# Patient Record
Sex: Female | Born: 1995 | Race: White | Hispanic: No | Marital: Married | Smoking: Never smoker
Health system: Southern US, Community
[De-identification: ages and names within clinical notes are randomized; demographics above are authoritative.]

## PROBLEM LIST (undated history)

## (undated) ENCOUNTER — Inpatient Hospital Stay: Payer: Self-pay

## (undated) DIAGNOSIS — R011 Cardiac murmur, unspecified: Secondary | ICD-10-CM

## (undated) DIAGNOSIS — F4310 Post-traumatic stress disorder, unspecified: Secondary | ICD-10-CM

## (undated) DIAGNOSIS — G459 Transient cerebral ischemic attack, unspecified: Secondary | ICD-10-CM

## (undated) DIAGNOSIS — F419 Anxiety disorder, unspecified: Secondary | ICD-10-CM

## (undated) DIAGNOSIS — F909 Attention-deficit hyperactivity disorder, unspecified type: Secondary | ICD-10-CM

## (undated) DIAGNOSIS — J45909 Unspecified asthma, uncomplicated: Secondary | ICD-10-CM

## (undated) DIAGNOSIS — F32A Depression, unspecified: Secondary | ICD-10-CM

## (undated) DIAGNOSIS — Z8679 Personal history of other diseases of the circulatory system: Secondary | ICD-10-CM

## (undated) DIAGNOSIS — R87629 Unspecified abnormal cytological findings in specimens from vagina: Secondary | ICD-10-CM

## (undated) DIAGNOSIS — R569 Unspecified convulsions: Secondary | ICD-10-CM

## (undated) HISTORY — DX: Cardiac murmur, unspecified: R01.1

## (undated) HISTORY — DX: Unspecified asthma, uncomplicated: J45.909

## (undated) HISTORY — DX: Unspecified abnormal cytological findings in specimens from vagina: R87.629

## (undated) HISTORY — PX: OTHER SURGICAL HISTORY: SHX169

## (undated) HISTORY — PX: WISDOM TOOTH EXTRACTION: SHX21

## (undated) HISTORY — DX: Personal history of other diseases of the circulatory system: Z86.79

## (undated) HISTORY — DX: Post-traumatic stress disorder, unspecified: F43.10

## (undated) HISTORY — DX: Depression, unspecified: F32.A

## (undated) HISTORY — DX: Transient cerebral ischemic attack, unspecified: G45.9

---
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
## 2015-07-21 ENCOUNTER — Emergency Department
Admission: EM | Admit: 2015-07-21 | Discharge: 2015-07-21 | Disposition: A | Discharge status: Home / Self Care | Payer: Medicaid - Out of State | Attending: Emergency Medicine | Admitting: Emergency Medicine

## 2015-07-21 ENCOUNTER — Emergency Department: Payer: Medicaid - Out of State

## 2015-07-21 ENCOUNTER — Encounter: Payer: Self-pay | Admitting: Emergency Medicine

## 2015-07-21 DIAGNOSIS — N390 Urinary tract infection, site not specified: Secondary | ICD-10-CM | POA: Insufficient documentation

## 2015-07-21 DIAGNOSIS — R079 Chest pain, unspecified: Secondary | ICD-10-CM | POA: Diagnosis present

## 2015-07-21 DIAGNOSIS — R55 Syncope and collapse: Secondary | ICD-10-CM | POA: Diagnosis not present

## 2015-07-21 LAB — CBC
HCT: 35.5 % (ref 35.0–47.0)
HEMOGLOBIN: 11.5 g/dL — AB (ref 12.0–16.0)
MCH: 25.8 pg — ABNORMAL LOW (ref 26.0–34.0)
MCHC: 32.5 g/dL (ref 32.0–36.0)
MCV: 79.3 fL — ABNORMAL LOW (ref 80.0–100.0)
Platelets: 287 10*3/uL (ref 150–440)
RBC: 4.48 MIL/uL (ref 3.80–5.20)
RDW: 16 % — ABNORMAL HIGH (ref 11.5–14.5)
WBC: 5.3 10*3/uL (ref 3.6–11.0)

## 2015-07-21 LAB — URINALYSIS COMPLETE WITH MICROSCOPIC (ARMC ONLY)
Bilirubin Urine: NEGATIVE
GLUCOSE, UA: NEGATIVE mg/dL
KETONES UR: NEGATIVE mg/dL
NITRITE: POSITIVE — AB
PROTEIN: NEGATIVE mg/dL
SPECIFIC GRAVITY, URINE: 1.024 (ref 1.005–1.030)
pH: 5 (ref 5.0–8.0)

## 2015-07-21 LAB — BASIC METABOLIC PANEL
ANION GAP: 4 — AB (ref 5–15)
BUN: 13 mg/dL (ref 6–20)
CALCIUM: 10.3 mg/dL (ref 8.9–10.3)
CO2: 24 mmol/L (ref 22–32)
Chloride: 112 mmol/L — ABNORMAL HIGH (ref 101–111)
Creatinine, Ser: 0.59 mg/dL (ref 0.44–1.00)
GLUCOSE: 107 mg/dL — AB (ref 65–99)
Potassium: 3.6 mmol/L (ref 3.5–5.1)
SODIUM: 140 mmol/L (ref 135–145)

## 2015-07-21 LAB — PREGNANCY, URINE: Preg Test, Ur: NEGATIVE

## 2015-07-21 MED ORDER — CEPHALEXIN 500 MG PO CAPS: 500.0000 mg | ORAL_CAPSULE | Freq: Four times a day (QID) | ORAL | Status: DC

## 2015-07-21 MED ORDER — CEPHALEXIN 500 MG PO CAPS
500.0000 mg | ORAL_CAPSULE | Freq: Once | ORAL | Status: AC
  Administered 2015-07-21: 500 mg via ORAL
  Filled 2015-07-21 (×2): qty 1

## 2015-07-21 NOTE — Discharge Instructions (Signed)
Please drink plenty of fluid to stay well hydrated, eat small regular meals throughout the day. Please get plenty of rest. Take the entire course of antibiotics, even if you're feeling better.  Return to the emergency department if you develop fainting, chest pain, shortness of breath, lightheadedness, fever, or any other symptoms concerning to you.

## 2015-07-21 NOTE — ED Notes (Addendum)
Pt c/o vag bleeding with clots since Sunday, states this morning she started spitting up blood. Pt states she is here today because she is  Having right sided rib pain. Pt A&O

## 2015-07-21 NOTE — ED Provider Notes (Signed)
[facility] Emergency Department Provider Note  ____________________________________________  Time seen: Approximately 4:41 PM  I have reviewed the triage vital signs and the nursing notes.   HISTORY  Chief Complaint Loss of Consciousness and Emesis    HPI Maureen George is a 20 y.o. female with a history of depression presenting with right-sided chest pain. The patient reports that she was cleaning her room when she had the acute onset of a sharp chest pain from the lower rib cage to the axilla. This was associated with diaphoresis but no shortness of breath, nausea or vomiting, palpitations, lightheadedness. She reports that the pain has been "slowly easing off, "and it has always completely resolved at this time. She has not recently had any cough or cold symptoms, fever or chills. She also states "I might have passed out." She describes that her brother in law found heron the ground and she did not know what had happened. She denies any headache, visual changes, speech changes or confusion. No urinary or fecal incontinence. She denies any leg swelling or calf pain, and does not take oral contraceptives nor does she smoke.  SH: Unemployed, engaged, denies tobacco or cocaine.  FH: No family history of blood clots.   History reviewed. No pertinent past medical history.  There are no active problems to display for this patient.   History reviewed. No pertinent past surgical history.  Current Outpatient Rx  Name  Route  Sig  Dispense  Refill  . cephALEXin (KEFLEX) 500 MG capsule   Oral   Take 1 capsule (500 mg total) by mouth 4 (four) times daily.   20 capsule   0     Allergies Review of patient's allergies indicates no known allergies.  No family history on file.  Social History Social History  Substance Use Topics  . Smoking status: Never Smoker   . Smokeless tobacco: None  . Alcohol Use: No    Review of Systems Constitutional: No  fever/chills.No lightheadedness. Possible syncope. Positive diaphoresis  Eyes: No visual changes. ENT: No sore throat. No congestion or rhinorrhea. Cardiovascular: Positive chest pain. Denies palpitations. Respiratory: Denies shortness of breath.  No cough. Gastrointestinal: No abdominal pain.  No nausea, no vomiting.  No diarrhea.  No constipation. Genitourinary: Negative for dysuria. Musculoskeletal: Negative for back pain. Skin: Negative for rash. Neurological: Negative for headaches. No focal numbness, tingling or weakness.   10-point ROS otherwise negative.  ____________________________________________   PHYSICAL EXAM:  VITAL SIGNS: ED Triage Vitals  Enc Vitals Group     BP 07/21/15 1453 111/75 mmHg     Pulse Rate 07/21/15 1453 92     Resp 07/21/15 1453 20     Temp 07/21/15 1453 98.2 F (36.8 C)     Temp Source 07/21/15 1453 Oral     SpO2 07/21/15 1453 100 %     Weight 07/21/15 1453 110 lb (49.896 kg)     Height 07/21/15 1453  (1.676 m)     Head Cir --      Peak Flow --      Pain Score 07/21/15 1454 6     Pain Loc --      Pain Edu? --      Excl. in GC? --     Constitutional: Patient is alert and oriented and able to answer questions appropriately. She does have a mild stutter and difficulty initiating words, which is baseline for her. She is nontoxic in appearance.  Eyes: Conjunctivae are normal.  EOMI. No scleral icterus. Head: Atraumatic. Nose: No congestion/rhinnorhea. Mouth/Throat: Mucous membranes are dry.  Neck: No stridor.  Supple.  No JVD. No meningismus. Cardiovascular: Normal rate, regular rhythm. No murmurs, rubs or gallops. No tenderness to palpation over the right chest. Respiratory: Normal respiratory effort.  No accessory muscle use or retractions. Lungs CTAB.  No wheezes, rales or ronchi. Gastrointestinal: Soft, nontender and nondistended.  No guarding or rebound.  No peritoneal signs. Musculoskeletal: No LE edema. No ttp in the calves or  palpable cords.  Negative Homan's sign. Neurologic:  A&Ox3.  Speech is clear.  Face and smile are symmetric.  EOMI.  Moves all extremities well. Skin:  Skin is warm, dry and intact. No rash noted. Psychiatric: Mood and affect are normal. Speech and behavior are normal.  Normal judgement.  ____________________________________________   LABS (all labs ordered are listed, but only abnormal results are displayed)  Labs Reviewed  BASIC METABOLIC PANEL - Abnormal; Notable for the following:    Chloride 112 (*)    Glucose, Bld 107 (*)    Anion gap 4 (*)    All other components within normal limits  CBC - Abnormal; Notable for the following:    Hemoglobin 11.5 (*)    MCV 79.3 (*)    MCH 25.8 (*)    RDW 16.0 (*)    All other components within normal limits  URINALYSIS COMPLETEWITH MICROSCOPIC ([facility] ONLY) - Abnormal; Notable for the following:    Color, Urine YELLOW (*)    APPearance CLOUDY (*)    Hgb urine dipstick 1+ (*)    Nitrite POSITIVE (*)    Leukocytes, UA 2+ (*)    Bacteria, UA MANY (*)    Squamous Epithelial / LPF 0-5 (*)    All other components within normal limits  PREGNANCY, URINE   ____________________________________________  EKG  ED ECG REPORT I, Rockne MenghiniNorman, Anne-Caroline, the attending physician, personally viewed and interpreted this ECG.   Date: 07/21/2015  EKG Time: 1608  Rate: 68  Rhythm: normal sinus rhythm  Axis: Normal  Intervals:none  ST&T Change: Nonspecific T-wave inversions in V1, V3, V4. No ST elevation. No evidence of prolonged QTC. No evidence of hypertrophy. No evidence of Brugada.  ____________________________________________  RADIOLOGY  Dg Chest 2 View  07/21/2015  CLINICAL DATA:  Chest pain for 1 day EXAM: CHEST  2 VIEW COMPARISON:  None. FINDINGS: The lungs are clear. The heart size and pulmonary vascularity are normal. No adenopathy. No bone lesions. There is mild bowel dilatation. 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 IMPRESSION: No edema or consolidation. Question a degree of bowel ileus. Apparent hypoplasia of right breast. Electronically Signed   By: Bretta BangWilliam  Woodruff III M.D.   On: 07/21/2015 17:06    ____________________________________________   PROCEDURES  Procedure(s) performed: None  Critical Care performed: No ____________________________________________   INITIAL IMPRESSION / ASSESSMENT AND PLAN / ED COURSE  Pertinent labs & imaging results that were available during my care of the patient were reviewed by me and considered in my medical decision making (see chart for details).  20 y.o. female, presenting with right-sided chest pain and a likely syncopal episode. On my exam, the patient has stable vital signs. The only notable finding is significant mouth dryness which may be from hypovolemia or dehydration. The patient does not have any arrhythmia, ischemic changes or findings concerning for hypertrophy, Brugada syndrome or prolonged QTC on her EKG. Her labs do show urinary tract infection and  will initiate antibiotics at this time. She has been completely asymptomatic from this standpoint. If the patient's chest x-ray is reassuring, I plan is to discharge the patient home with close PMD follow-up.  ----------------------------------------- 5:13 PM on 07/21/2015 -----------------------------------------  The patient's chest x-ray does not show any acute cardiopulmonary abnormalities. There is a question of possible ileus, but I have again reviewed with the patient that she has not been having any GI symptoms including nausea vomiting diarrhea or constipation, nor any abdominal pain. She does not have any distention on her belly and there is no other recent why she would have an ileus. She does understand return precautions as well as follow-up instructions.  ____________________________________________  FINAL CLINICAL IMPRESSION(S) / ED DIAGNOSES  Final diagnoses:  Chest pain,  unspecified chest pain type  Syncope, unspecified syncope type  UTI (lower urinary tract infection)      NEW MEDICATIONS STARTED DURING THIS VISIT:  New Prescriptions   CEPHALEXIN (KEFLEX) 500 MG CAPSULE    Take 1 capsule (500 mg total) by mouth 4 (four) times daily.     Rockne Menghini, MD 07/21/15 1714

## 2015-07-21 NOTE — ED Notes (Signed)
Pt comes into the ED via EMS from home with c/o not feeling well for the past few days with N/V.Maureen George. States today the brother found the pt on the floor and was able to arouse her without a problem, states pt is c/o left rib pain from fall..Maureen George

## 2015-08-13 ENCOUNTER — Encounter: Payer: Self-pay | Admitting: *Deleted

## 2015-08-13 ENCOUNTER — Emergency Department
Admission: EM | Admit: 2015-08-13 | Discharge: 2015-08-14 | Disposition: A | Discharge status: Home / Self Care | Payer: Medicaid - Out of State | Attending: Emergency Medicine | Admitting: Emergency Medicine

## 2015-08-13 DIAGNOSIS — F909 Attention-deficit hyperactivity disorder, unspecified type: Secondary | ICD-10-CM | POA: Insufficient documentation

## 2015-08-13 DIAGNOSIS — F419 Anxiety disorder, unspecified: Secondary | ICD-10-CM | POA: Insufficient documentation

## 2015-08-13 HISTORY — DX: Anxiety disorder, unspecified: F41.9

## 2015-08-13 HISTORY — DX: Attention-deficit hyperactivity disorder, unspecified type: F90.9

## 2015-08-13 LAB — CBC
HEMATOCRIT: 36.4 % (ref 35.0–47.0)
HEMOGLOBIN: 11.7 g/dL — AB (ref 12.0–16.0)
MCH: 25.7 pg — ABNORMAL LOW (ref 26.0–34.0)
MCHC: 32.1 g/dL (ref 32.0–36.0)
MCV: 80.2 fL (ref 80.0–100.0)
Platelets: 279 10*3/uL (ref 150–440)
RBC: 4.54 MIL/uL (ref 3.80–5.20)
RDW: 16.9 % — AB (ref 11.5–14.5)
WBC: 7.1 10*3/uL (ref 3.6–11.0)

## 2015-08-13 LAB — POCT PREGNANCY, URINE: Preg Test, Ur: NEGATIVE

## 2015-08-13 LAB — URINE DRUG SCREEN, QUALITATIVE (ARMC ONLY)
Amphetamines, Ur Screen: NOT DETECTED
BARBITURATES, UR SCREEN: NOT DETECTED
BENZODIAZEPINE, UR SCRN: NOT DETECTED
COCAINE METABOLITE, UR ~~LOC~~: NOT DETECTED
Cannabinoid 50 Ng, Ur ~~LOC~~: NOT DETECTED
MDMA (Ecstasy)Ur Screen: NOT DETECTED
Methadone Scn, Ur: NOT DETECTED
OPIATE, UR SCREEN: NOT DETECTED
PHENCYCLIDINE (PCP) UR S: NOT DETECTED
Tricyclic, Ur Screen: NOT DETECTED

## 2015-08-13 LAB — ACETAMINOPHEN LEVEL: Acetaminophen (Tylenol), Serum: <10 ug/mL — ABNORMAL LOW (ref 10–30)

## 2015-08-13 LAB — COMPREHENSIVE METABOLIC PANEL
ALBUMIN: 4.8 g/dL (ref 3.5–5.0)
ALK PHOS: 88 U/L (ref 38–126)
ALT: 15 U/L (ref 14–54)
ANION GAP: 7 (ref 5–15)
AST: 19 U/L (ref 15–41)
BILIRUBIN TOTAL: 0.4 mg/dL (ref 0.3–1.2)
BUN: 11 mg/dL (ref 6–20)
CALCIUM: 10 mg/dL (ref 8.9–10.3)
CO2: 23 mmol/L (ref 22–32)
CREATININE: 0.57 mg/dL (ref 0.44–1.00)
Chloride: 109 mmol/L (ref 101–111)
GFR calc non Af Amer: >60 mL/min (ref >60)
GLUCOSE: 91 mg/dL (ref 65–99)
Potassium: 4 mmol/L (ref 3.5–5.1)
Sodium: 139 mmol/L (ref 135–145)
TOTAL PROTEIN: 7.9 g/dL (ref 6.5–8.1)

## 2015-08-13 LAB — ETHANOL: Alcohol, Ethyl (B): <5 mg/dL (ref <5)

## 2015-08-13 LAB — SALICYLATE LEVEL: Salicylate Lvl: <4 mg/dL (ref 2.8–30.0)

## 2015-08-13 MED ORDER — TRAZODONE HCL 50 MG PO TABS
50.0000 mg | ORAL_TABLET | Freq: Every day | ORAL | Status: DC
Start: 2015-08-13 — End: 2018-03-21

## 2015-08-13 MED ORDER — ACETAMINOPHEN 325 MG PO TABS
650.0000 mg | ORAL_TABLET | Freq: Once | ORAL | Status: AC
  Administered 2015-08-13: 650 mg via ORAL
  Filled 2015-08-13: qty 2

## 2015-08-13 MED ORDER — SERTRALINE HCL 50 MG PO TABS: 50.0000 mg | ORAL_TABLET | Freq: Every day | ORAL | Status: DC

## 2015-08-13 NOTE — BH Assessment (Signed)
Assessment Note  Maureen George is an 20 y.o. female presenting to the ED with concerns of  suddenly having thoughts of harming herself. Pt states she has not taken her zoloft x 3 weeks. Pt reports she hasn't been sleeping well for the past year. She reports she has a prescription for Zoloft but has not had a chance to pick it up.  She also reports since moving to [place], she has not had the chance to connect with an outpatient mental health provider. Pt denies specific plan to harm self. Pt admits to SI in 2014.  She denies any drug alcohol use.  She denies any auditory/visual hallucinations.  Diagnosis: Anxiety  Past Medical History:  Past Medical History  Diagnosis Date  . Anxiety   . ADHD (attention deficit hyperactivity disorder)     Past Surgical History  Procedure Laterality Date  . Wisdom tooth extraction      Family History: History reviewed. No pertinent family history.  Social History:  reports that she has never smoked. She has never used smokeless tobacco. She reports that she drinks alcohol. She reports that she does not use illicit drugs.  Additional Social History:  Alcohol / Drug Use History of alcohol / drug use?: No history of alcohol / drug abuse (Pt denies)  CIWA: CIWA-Ar BP: (!) 109/54 mmHg Pulse Rate: 75 COWS:    Allergies: No Known Allergies  Home Medications:  (Not in a hospital admission)  OB/GYN Status:  Patient's last menstrual period was 08/02/2015 (exact date).  General Assessment Data Location of Assessment: [facility] ED TTS Assessment: In system Is this a Tele or Face-to-Face Assessment?: Face-to-Face Is this an Initial Assessment or a Re-assessment for this encounter?: Initial Assessment Marital status: Married [place] name: N/A Is patient pregnant?: No Pregnancy Status: No Living Arrangements: Spouse/significant other Can pt return to current living arrangement?: Yes Admission Status: Voluntary Is patient capable of signing voluntary admission?:  Yes Referral Source: Self/Family/Friend Insurance type: Medicaid out of state  Medical Screening Exam [facility] Walk-in ONLY) Medical Exam completed: Yes  Crisis Care Plan Living Arrangements: Spouse/significant other Legal Guardian: Other: (self) Name of Psychiatrist: None reported Name of Therapist: None reported  Education Status Is patient currently in school?: No Current Grade: N/A Highest grade of school patient has completed: N/A Name of school: N/A Contact person: N/A  Risk to self with the past 6 months Suicidal Ideation: Yes-Currently Present Has patient been a risk to self within the past 6 months prior to admission? : No Suicidal Intent: No Has patient had any suicidal intent within the past 6 months prior to admission? : No Is patient at risk for suicide?: No Suicidal Plan?: No Has patient had any suicidal plan within the past 6 months prior to admission? : No Access to Means: No What has been your use of drugs/alcohol within the last 12 months?: None reported Previous Attempts/Gestures: Yes How many times?: 1 Other Self Harm Risks: None reported Triggers for Past Attempts: None known Intentional Self Injurious Behavior: None Family Suicide History: No Recent stressful life event(s): Other (Comment) Persecutory voices/beliefs?: No Depression: No Substance abuse history and/or treatment for substance abuse?: No Suicide prevention information given to non-admitted patients: Not applicable  Risk to Others within the past 6 months Homicidal Ideation: No Does patient have any lifetime risk of violence toward others beyond the six months prior to admission? : No Thoughts of Harm to Others: No Current Homicidal Intent: No Current Homicidal Plan: No Access to Homicidal Means: No  Identified Victim: None identified History of harm to others?: No Assessment of Violence: None Noted Violent Behavior Description: None identified Does patient have access to weapons?:  No Criminal Charges Pending?: No Does patient have a court date: No Is patient on probation?: No  Psychosis Hallucinations: None noted Delusions: None noted  Mental Status Report Appearance/Hygiene: In scrubs Eye Contact: Good Motor Activity: Freedom of movement Speech: Logical/coherent Level of Consciousness: Alert Mood: Anxious Affect: Anxious, Appropriate to circumstance Anxiety Level: Minimal Thought Processes: Coherent, Relevant Judgement: Unimpaired Orientation: Person, Place, Time, Situation Obsessive Compulsive Thoughts/Behaviors: None  Cognitive Functioning Concentration: Good Memory: Recent Intact, Remote Intact IQ: Average Insight: Good Impulse Control: Good Appetite: Good Weight Loss: 0 Weight Gain: 0 Sleep: Decreased Total Hours of Sleep: 2 Vegetative Symptoms: None  ADLScreening [facility] Assessment Services) Patient's cognitive ability adequate to safely complete daily activities?: Yes Patient able to express need for assistance with ADLs?: Yes Independently performs ADLs?: Yes (appropriate for developmental age)  Prior Inpatient Therapy Prior Inpatient Therapy: Yes Prior Therapy Dates: 2014 [facility] Reason for Treatment: suicidal  Prior Outpatient Therapy Prior Outpatient Therapy: No Prior Therapy Dates: N/a Prior Therapy Facilty/Provider(s): N/a Reason for Treatment: N/A Does patient have an ACCT team?: No Does patient have Intensive In-House Services?  : No Does patient have [facility] services? : No Does patient have P4CC services?: No  ADL Screening (condition at time of admission) Patient's cognitive ability adequate to safely complete daily activities?: Yes Patient able to express need for assistance with ADLs?: Yes Independently performs ADLs?: Yes (appropriate for developmental age)       Abuse/Neglect Assessment (Assessment to be complete while patient is alone) Physical Abuse: Denies Verbal Abuse:  Denies Sexual Abuse: Denies Exploitation of patient/patient's resources: Denies Self-Neglect: Denies Values / Beliefs Cultural Requests During Hospitalization: None Spiritual Requests During Hospitalization: None Consults Spiritual Care Consult Needed: No Social Work Consult Needed: No Merchant navy officerAdvance Directives (For Healthcare) Does patient have an advance directive?: No Would patient like information on creating an advanced directive?: Yes English as a second language teacher- Educational materials given    Additional Information 1:1 In Past 12 Months?: No CIRT Risk: No Elopement Risk: No Does patient have medical clearance?: Yes     Disposition:  Disposition Initial Assessment Completed for this Encounter: Yes Disposition of Patient: Other dispositions Other disposition(s): Other (Comment) (Pending)  On Site Evaluation by:   Reviewed with Physician:    Artist Beachoxana C Murle Hellstrom 08/13/2015 8:20 PM

## 2015-08-13 NOTE — ED Notes (Signed)
SOC complete.  

## 2015-08-13 NOTE — ED Notes (Signed)
Patient states some pain to right wrist. States she fell today at home. No deformity, swelling or bruising noted. Full ROM. Will make MD aware.

## 2015-08-13 NOTE — ED Notes (Signed)
Pt states she suddenly had thoughts of harming herself. Pt states she has not taken her zoloft x 3 weeks. Pt endorses poor sleep habits for at least a year. Pt states we might "see her husband" tonight. Pt states her husband at work. Pt states her son is with his father, who is not her husband. Pt states her husband's mother will be letting him know she is at the hospital. Pt states her husband "goes crazy" if he thinks "anything" has happened to her. Pt denies specific plan to harm self. Pt admits to SI in past. Pt's last episode of SI that she was treated for in 2014.

## 2015-08-13 NOTE — ED Notes (Signed)
telepsych in progress now 

## 2015-08-13 NOTE — ED Provider Notes (Signed)
[facility] Emergency Department Provider Note  ____________________________________________    I have reviewed the triage vital signs and the nursing notes.   HISTORY  Chief Complaint Suicidal    HPI Maureen George is a 20 y.o. female who presents with complaints of anxiety and thoughts of harming herself. Patient reports she was doing laundry and suddenly had thoughts of harming herself. She reports these thoughts have resolved and she feels well now. She denies physical complaints. No ingestions or self injury. She has no plan for hurting herself     Past Medical History  Diagnosis Date  . Anxiety   . ADHD (attention deficit hyperactivity disorder)     There are no active problems to display for this patient.   Past Surgical History  Procedure Laterality Date  . Wisdom tooth extraction      Current Outpatient Rx  Name  Route  Sig  Dispense  Refill  . sertraline (ZOLOFT) 100 MG tablet   Oral   Take 1 tablet by mouth daily. Reported on 08/13/2015           Allergies Review of patient's allergies indicates no known allergies.  History reviewed. No pertinent family history.  Social History Social History  Substance Use Topics  . Smoking status: Never Smoker   . Smokeless tobacco: Never Used  . Alcohol Use: Yes    Review of Systems  Constitutional: Negative for fever. Eyes: Negative for redness ENT: Negative for sore throat Cardiovascular: Negative for chest pain Respiratory: Negative for shortness of breath. Gastrointestinal: Negative for abdominal pain Genitourinary: Negative for dysuria. Musculoskeletal: Negative for back pain.Mild right wrist pain Skin: Negative for rash. Neurological: Negative for focal weakness Psychiatric:As above    ____________________________________________   PHYSICAL EXAM:  VITAL SIGNS: ED Triage Vitals  Enc Vitals Group     BP 08/13/15 1907 109/54 mmHg     Pulse Rate 08/13/15 1907 75    Resp 08/13/15 1907 20     Temp 08/13/15 1907 97.6 F (36.4 C)     Temp Source 08/13/15 1907 Oral     SpO2 08/13/15 1907 100 %     Weight 08/13/15 1907 105 lb (47.628 kg)     Height 08/13/15 1907  (1.651 m)     Head Cir --      Peak Flow --      Pain Score --      Pain Loc --      Pain Edu? --      Excl. in GC? --      Constitutional: Alert and oriented. Well appearing and in no distress.  Eyes: Conjunctivae are normal. No erythema or injection ENT   Head: Normocephalic and atraumatic.   Mouth/Throat: Mucous membranes are moist. Cardiovascular: Normal rate, regular rhythm. Normal and symmetric distal pulses are present in the upper extremities.  Respiratory: Normal respiratory effort without tachypnea nor retractions. Breath sounds are clear and equal bilaterally.  Gastrointestinal: Soft and non-tender in all quadrants. No distention. There is no CVA tenderness. Genitourinary: deferred Musculoskeletal: Nontender with normal range of motion in all extremities. No tenderness to palpation of the right wrist, normal range of motion. 2+ pulses. No bony tenderness. No lower extremity tenderness nor edema. Neurologic:  Normal speech and language. No gross focal neurologic deficits are appreciated. Skin:  Skin is warm, dry and intact. No rash noted. Psychiatric: Mood and affect are normal. Patient exhibits appropriate insight and judgment.  ____________________________________________    LABS (pertinent positives/negatives)  Labs Reviewed  ACETAMINOPHEN LEVEL - Abnormal; Notable for the following:    Acetaminophen (Tylenol), Serum <10 (*)    All other components within normal limits  CBC - Abnormal; Notable for the following:    Hemoglobin 11.7 (*)    MCH 25.7 (*)    RDW 16.9 (*)    All other components within normal limits  COMPREHENSIVE METABOLIC PANEL  ETHANOL  SALICYLATE LEVEL  URINE DRUG SCREEN, QUALITATIVE ([facility] ONLY)  POCT PREGNANCY, URINE  POC URINE PREG,  ED    ____________________________________________   EKG  None  ____________________________________________    RADIOLOGY  None  ____________________________________________   PROCEDURES  Procedure(s) performed: none  Critical Care performed: none  ____________________________________________   INITIAL IMPRESSION / ASSESSMENT AND PLAN / ED COURSE  Pertinent labs & imaging results that were available during my care of the patient were reviewed by me and considered in my medical decision making (see chart for details).  Patient well-appearing and in no distress. Her lab work is unremarkable. Suspect she is probably safe for discharge but we will consult telepsychiatry.  ----------------------------------------- 11:05 PM on 08/13/2015 -----------------------------------------  [facility], recommend Zoloft prescription and trazodone prescription which I will write  ____________________________________________   FINAL CLINICAL IMPRESSION(S) / ED DIAGNOSES  Final diagnoses:  Anxiety  Suicidal ideation        Jene Everyobert Vinson Tietze, MD 08/13/15 2334

## 2015-08-13 NOTE — ED Notes (Signed)
Telepsych set up in patient's room. 

## 2015-08-14 NOTE — ED Notes (Signed)
Patient oriented, called several people for ride home, she states that her husband was out fishing,and she did get her uncle that lives in [place] to come pick her up. Patient states that she is happy that he is coming because she has not seen him in a long time. Patient is pleasant and smiling.

## 2015-08-14 NOTE — ED Notes (Signed)
Patient's breakfast served, Patient oriented, no evidence of distress at this time, denies Si/hi , q 15 min. Checks, and camera surveillance.

## 2015-08-14 NOTE — ED Notes (Signed)
D: Pt received from ED-quad, waiting to be discharged home. Patient alert and oriented x4. Patient denies SI/HI/AVH. Pt affect is pleasant. Pt is calm and appropriate to situation.Pt denies feeling anxious or depressed. A: Offered active listening and support. Provided therapeutic communication. R: Pt pleasant and cooperative. Pt lying in bed, awaiting ride home. Will continue Q15 min. checks. Safety maintained.

## 2015-08-14 NOTE — ED Notes (Signed)
Patient with discharge instructions, Patient oriented, voices understanding, prescriptions given, and all belongings given back to Patient. Patient is pleasant and her uncle is going to transport home.

## 2015-09-03 ENCOUNTER — Emergency Department: Payer: Medicaid Other

## 2015-09-03 ENCOUNTER — Encounter: Payer: Self-pay | Admitting: *Deleted

## 2015-09-03 DIAGNOSIS — Z79899 Other long term (current) drug therapy: Secondary | ICD-10-CM | POA: Insufficient documentation

## 2015-09-03 DIAGNOSIS — F909 Attention-deficit hyperactivity disorder, unspecified type: Secondary | ICD-10-CM | POA: Insufficient documentation

## 2015-09-03 DIAGNOSIS — W228XXA Striking against or struck by other objects, initial encounter: Secondary | ICD-10-CM | POA: Insufficient documentation

## 2015-09-03 DIAGNOSIS — S8011XA Contusion of right lower leg, initial encounter: Secondary | ICD-10-CM | POA: Insufficient documentation

## 2015-09-03 DIAGNOSIS — Y929 Unspecified place or not applicable: Secondary | ICD-10-CM | POA: Diagnosis not present

## 2015-09-03 DIAGNOSIS — Y939 Activity, unspecified: Secondary | ICD-10-CM | POA: Insufficient documentation

## 2015-09-03 DIAGNOSIS — S8991XA Unspecified injury of right lower leg, initial encounter: Secondary | ICD-10-CM | POA: Diagnosis present

## 2015-09-03 DIAGNOSIS — Y999 Unspecified external cause status: Secondary | ICD-10-CM | POA: Diagnosis not present

## 2015-09-03 MED ORDER — IBUPROFEN 400 MG PO TABS
400.0000 mg | ORAL_TABLET | Freq: Once | ORAL | Status: AC | PRN
  Administered 2015-09-03: 400 mg via ORAL

## 2015-09-03 MED ORDER — IBUPROFEN 400 MG PO TABS
ORAL_TABLET | ORAL | Status: DC
Start: 2015-09-03 — End: 2015-09-04
  Filled 2015-09-03: qty 1

## 2015-09-03 NOTE — ED Notes (Signed)
Pt presents w/ c/o R leg pain and difficulty bearing weight. Pt states she shut her R leg in a camper door at 1100 today and it has gotten progressively painful over the day. Pt states unable to bear weight on R leg and bending knee is painful.

## 2015-09-04 ENCOUNTER — Emergency Department
Admission: EM | Admit: 2015-09-04 | Discharge: 2015-09-04 | Disposition: A | Discharge status: Home / Self Care | Payer: Medicaid Other | Attending: Emergency Medicine | Admitting: Emergency Medicine

## 2015-09-04 DIAGNOSIS — S8011XA Contusion of right lower leg, initial encounter: Secondary | ICD-10-CM

## 2015-09-04 MED ORDER — KETOROLAC TROMETHAMINE 60 MG/2ML IM SOLN
60.0000 mg | Freq: Once | INTRAMUSCULAR | Status: AC
  Administered 2015-09-04: 60 mg via INTRAMUSCULAR
  Filled 2015-09-04: qty 2

## 2015-09-04 MED ORDER — KETOROLAC TROMETHAMINE 10 MG PO TABS: 10.0000 mg | ORAL_TABLET | Freq: Three times a day (TID) | ORAL | Status: AC | PRN

## 2015-09-04 MED ORDER — KETOROLAC TROMETHAMINE 10 MG PO TABS
10.0000 mg | ORAL_TABLET | Freq: Once | ORAL | Status: DC
  Filled 2015-09-04: qty 1

## 2015-09-04 NOTE — ED Provider Notes (Signed)
[facility] Emergency Department Provider Note  ____________________________________________  Time seen: 12:26 AM  I have reviewed the triage vital signs and the nursing notes.   HISTORY  Chief Complaint Leg Injury      HPI Graylon GunningJerri Reposa is a 20 y.o. female presents with history of accidentally slamming her right leg in a camper door at 11 AM yesterday morning with progressive discomfort since that time. Patient states pain is predominantly right lower leg denies any knee pain     Past Medical History  Diagnosis Date  . Anxiety   . ADHD (attention deficit hyperactivity disorder)     There are no active problems to display for this patient.   Past Surgical History  Procedure Laterality Date  . Wisdom tooth extraction      Current Outpatient Rx  Name  Route  Sig  Dispense  Refill  . sertraline (ZOLOFT) 50 MG tablet   Oral   Take 1 tablet (50 mg total) by mouth daily. For 3 days, then begin taking 2 tablets (100 mg) by mouth daily   20 tablet   0   . traZODone (DESYREL) 50 MG tablet   Oral   Take 1 tablet (50 mg total) by mouth at bedtime.   20 tablet   0     Allergies No known drug allergies History reviewed. No pertinent family history.  Social History Social History  Substance Use Topics  . Smoking status: Never Smoker   . Smokeless tobacco: Never Used  . Alcohol Use: Yes    Review of Systems  Constitutional: Negative for fever. Eyes: Negative for visual changes. ENT: Negative for sore throat. Cardiovascular: Negative for chest pain. Respiratory: Negative for shortness of breath. Gastrointestinal: Negative for abdominal pain, vomiting and diarrhea. Genitourinary: Negative for dysuria. Musculoskeletal: Negative for back pain.Positive for right leg pain Skin: Negative for rash. Neurological: Negative for headaches, focal weakness or numbness.   10-point ROS otherwise  negative.  ____________________________________________   PHYSICAL EXAM:  VITAL SIGNS: ED Triage Vitals  Enc Vitals Group     BP 09/03/15 2302 99/51 mmHg     Pulse Rate 09/03/15 2302 70     Resp 09/03/15 2302 20     Temp 09/03/15 2302 98.3 F (36.8 C)     Temp Source 09/03/15 2302 Oral     SpO2 09/03/15 2302 100 %     Weight 09/03/15 2302 110 lb (49.896 kg)     Height 09/03/15 2302 5\' 6"  (1.676 m)     Head Cir --      Peak Flow --      Pain Score 09/03/15 2303 8     Pain Loc --      Pain Edu? --      Excl. in GC? --    Constitutional: Alert and oriented. Well appearing and in no distress. Eyes: Conjunctivae are normal. PERRL. Normal extraocular movements. ENT   Head: Normocephalic and atraumatic.   Nose: No congestion/rhinnorhea.   Mouth/Throat: Mucous membranes are moist.   Neck: No stridor. Hematological/Lymphatic/Immunilogical: No cervical lymphadenopathy. Cardiovascular: Normal rate, regular rhythm. Normal and symmetric distal pulses are present in all extremities. No murmurs, rubs, or gallops. Respiratory: Normal respiratory effort without tachypnea nor retractions. Breath sounds are clear and equal bilaterally. No wheezes/rales/rhonchi. Gastrointestinal: Soft and nontender. No distention. There is no CVA tenderness. Genitourinary: deferred Musculoskeletal: Pain with palpation lateral malleoli. No pain with palpation or movement of the knee active or passive range of motion.  Neurologic:  Normal speech and language. No gross focal neurologic deficits are appreciated. Speech is normal.  Skin:  Skin is warm, dry and intact. No rash noted. Psychiatric: Mood and affect are normal. Speech and behavior are normal. Patient exhibits appropriate insight and judgment.   RADIOLOGY     DG Tibia/Fibula Right (Final result) Result time: 09/03/15 23:51:56   Final result by Rad Results In Interface (09/03/15 23:51:56)   Narrative:   CLINICAL DATA: 20 year old  female with right lower extremity pain  EXAM: RIGHT TIBIA AND FIBULA - 2 VIEW  COMPARISON: None.  FINDINGS: There is no evidence of fracture or other focal bone lesions. Soft tissues are unremarkable.  IMPRESSION: Negative.   Electronically Signed By: Elgie CollardArash Radparvar M.D. On: 09/03/2015 23:51       Procedures     INITIAL IMPRESSION / ASSESSMENT AND PLAN / ED COURSE  Pertinent labs & imaging results that were available during my care of the patient were reviewed by me and considered in my medical decision making (see chart for details).  Ankle stirrup applied crutches given. Patient received Toradol 30 mg IM shot  ____________________________________________   FINAL CLINICAL IMPRESSION(S) / ED DIAGNOSES  Final diagnoses:  Multiple leg contusions, right, initial encounter      Darci Currentandolph N Matti Minney, MD 09/04/15 [phone]

## 2015-09-04 NOTE — ED Notes (Signed)
Pt discharged to home.  Friend driving.  Discharge instructions reviewed.  Verbalized understanding.  No questions or concerns at this time.  Teach back verified.  Pt in NAD.  No items left in ED.   

## 2015-09-04 NOTE — Discharge Instructions (Signed)

## 2015-09-04 NOTE — ED Notes (Signed)
MD Brown at bedside.

## 2017-08-10 ENCOUNTER — Emergency Department: Payer: Self-pay

## 2017-08-10 ENCOUNTER — Emergency Department
Admission: EM | Admit: 2017-08-10 | Discharge: 2017-08-10 | Disposition: A | Discharge status: Home / Self Care | Payer: Self-pay | Attending: Emergency Medicine | Admitting: Emergency Medicine

## 2017-08-10 ENCOUNTER — Encounter: Payer: Self-pay | Admitting: Emergency Medicine

## 2017-08-10 ENCOUNTER — Other Ambulatory Visit: Payer: Self-pay

## 2017-08-10 DIAGNOSIS — Y9234 Swimming pool (public) as the place of occurrence of the external cause: Secondary | ICD-10-CM | POA: Insufficient documentation

## 2017-08-10 DIAGNOSIS — W16012A Fall into swimming pool striking water surface causing other injury, initial encounter: Secondary | ICD-10-CM | POA: Insufficient documentation

## 2017-08-10 DIAGNOSIS — S93499A Sprain of other ligament of unspecified ankle, initial encounter: Secondary | ICD-10-CM

## 2017-08-10 DIAGNOSIS — Z79899 Other long term (current) drug therapy: Secondary | ICD-10-CM | POA: Insufficient documentation

## 2017-08-10 DIAGNOSIS — F419 Anxiety disorder, unspecified: Secondary | ICD-10-CM | POA: Insufficient documentation

## 2017-08-10 DIAGNOSIS — Y998 Other external cause status: Secondary | ICD-10-CM | POA: Insufficient documentation

## 2017-08-10 DIAGNOSIS — S93491A Sprain of other ligament of right ankle, initial encounter: Secondary | ICD-10-CM | POA: Insufficient documentation

## 2017-08-10 DIAGNOSIS — F909 Attention-deficit hyperactivity disorder, unspecified type: Secondary | ICD-10-CM | POA: Insufficient documentation

## 2017-08-10 DIAGNOSIS — Y9389 Activity, other specified: Secondary | ICD-10-CM | POA: Insufficient documentation

## 2017-08-10 MED ORDER — MELOXICAM 15 MG PO TABS: 15.0000 mg | ORAL_TABLET | Freq: Every day | ORAL | 1 refills | Status: AC

## 2017-08-10 NOTE — ED Provider Notes (Signed)
[facility] Emergency Department Provider Note  ____________________________________________  Time seen: Approximately 11:12 PM  I have reviewed the triage vital signs and the nursing notes.   HISTORY  Chief Complaint Ankle Pain    HPI Maureen George is a 22 y.o. female with a history of anxiety, presents to the emergency department with 7 out of 10 aching and nonradiating right ankle pain after patient reports that approximately 1 week ago she jumped into a pool and landed hard on her right ankle.  Patient reports that she was evaluated at emergency department in [place] and was diagnosed with a right ankle sprain.  Patient reports that her symptoms have not improved and she became concerned.  No alleviating measures have been attempted.  [place]   Past Medical History:  Diagnosis Date  . ADHD (attention deficit hyperactivity disorder)   . Anxiety     There are no active problems to display for this patient.   Past Surgical History:  Procedure Laterality Date  . WISDOM TOOTH EXTRACTION      Prior to Admission medications   Medication Sig Start Date End Date Taking? Authorizing Provider  meloxicam (MOBIC) 15 MG tablet Take 1 tablet (15 mg total) by mouth daily for 7 days. 08/10/17 08/17/17  Orvil FeilWoods, Jaclyn M, PA-C  sertraline (ZOLOFT) 50 MG tablet Take 1 tablet (50 mg total) by mouth daily. For 3 days, then begin taking 2 tablets (100 mg) by mouth daily 08/13/15 08/12/16  Jene EveryKinner, Robert, MD  traZODone (DESYREL) 50 MG tablet Take 1 tablet (50 mg total) by mouth at bedtime. 08/13/15   Jene EveryKinner, Robert, MD    Allergies Naproxen  History reviewed. No pertinent family history.  Social History Social History   Tobacco Use  . Smoking status: Never Smoker  . Smokeless tobacco: Never Used  Substance Use Topics  . Alcohol use: Yes  . Drug use: No     Review of Systems  Constitutional: No fever/chills Eyes: No visual  changes. No discharge ENT: No upper respiratory complaints. Cardiovascular: no chest pain. Respiratory: no cough. No SOB. Gastrointestinal: No abdominal pain.  No nausea, no vomiting.  No diarrhea.  No constipation. Musculoskeletal: Patient has right ankle pain. Skin: Negative for rash, abrasions, lacerations, ecchymosis. Neurological: Negative for headaches, focal weakness or numbness.  ____________________________________________   PHYSICAL EXAM:  VITAL SIGNS: ED Triage Vitals  Enc Vitals Group     BP 08/10/17 1952 (!) 102/49     Pulse Rate 08/10/17 1952 77     Resp 08/10/17 1952 18     Temp 08/10/17 1952 97.6 F (36.4 C)     Temp Source 08/10/17 1952 Oral     SpO2 08/10/17 1952 100 %     Weight 08/10/17 1953 110 lb (49.9 kg)     Height 08/10/17 1953 5\' 6"  (1.676 m)     Head Circumference --      Peak Flow --      Pain Score 08/10/17 1953 10     Pain Loc --      Pain Edu? --      Excl. in GC? --      Constitutional: Alert and oriented. Well appearing and in no acute distress. Eyes: Conjunctivae are normal. PERRL. EOMI. Head: Atraumatic. Cardiovascular: Normal rate, regular rhythm. Normal S1 and S2.  Good peripheral circulation. Respiratory: Normal respiratory effort without tachypnea or retractions. Lungs CTAB. Good air entry to the bases with no decreased or absent breath  sounds. Musculoskeletal: Patient is unable to perform full range of motion at the right ankle, likely secondary to pain.  She is able to move all 5 right toes.  Tenderness is elicited with palpation over the anterior talofibular ligament and posterior talofibular ligament.  Palpable dorsalis pedis pulse, right. Neurologic:  Normal speech and language. No gross focal neurologic deficits are appreciated.  Skin:  Skin is warm, dry and intact. No rash noted. Psychiatric: Mood and affect are normal. Speech and behavior are normal. Patient exhibits appropriate insight and  judgement.   ____________________________________________   LABS (all labs ordered are listed, but only abnormal results are displayed)  Labs Reviewed - No data to display ____________________________________________  EKG   ____________________________________________  RADIOLOGY Geraldo Pitter, personally viewed and evaluated these images (plain radiographs) as part of my medical decision making, as well as reviewing the written report by the radiologist.  Dg Ankle Complete Right  Result Date: 08/10/2017 CLINICAL DATA:  Right ankle pain after jumping into a pool and landing on the bottom 3 days ago. EXAM: RIGHT ANKLE - COMPLETE 3+ VIEW COMPARISON:  Right lower leg dated 09/03/2015. FINDINGS: There is no evidence of fracture, dislocation, or joint effusion. There is no evidence of arthropathy or other focal bone abnormality. Soft tissues are unremarkable. IMPRESSION: Normal examination. Electronically Signed   By: Beckie Salts M.D.   On: 08/10/2017 20:29    ____________________________________________    PROCEDURES  Procedure(s) performed:    Procedures    Medications - No data to display   ____________________________________________   INITIAL IMPRESSION / ASSESSMENT AND PLAN / ED COURSE  Pertinent labs & imaging results that were available during my care of the patient were reviewed by me and considered in my medical decision making (see chart for details).  Review of the [place] CSRS was performed in accordance of the [facility] prior to dispensing any controlled drugs.     Assessment and plan Right ankle sprain Patient presents to the emergency department with right ankle pain after sustaining an inversion type ankle injury approximately 1 week ago after jumping into a pool.  Differential diagnosis included sprain versus fracture.  No acute bony abnormalities were identified on x-ray examination of the right ankle.  Crutches were provided and Ace wrap was applied right  ankle.  Rest, compression, ice and elevation were recommended.  Patient was referred to podiatry.  All patient questions were answered.    ____________________________________________  FINAL CLINICAL IMPRESSION(S) / ED DIAGNOSES  Final diagnoses:  Sprain of anterior talofibular ligament, unspecified laterality, initial encounter      NEW MEDICATIONS STARTED DURING THIS VISIT:  ED Discharge Orders        Ordered    meloxicam (MOBIC) 15 MG tablet  Daily     08/10/17 2153          This chart was dictated using voice recognition software/Dragon. Despite best efforts to proofread, errors can occur which can change the meaning. Any change was purely unintentional.    Orvil Feil, PA-C 08/10/17 2315    Phineas Semen, MD 08/12/17 [phone]

## 2017-08-10 NOTE — ED Notes (Signed)
Patient to waiting room via wheelchair by EMS.  Per EMS patient report injured right ankle jumping into a pool on Tuesday, not feeling any better.  EMS VS:  HR - 73; BP 120/62; pulse oxi 99% on room air.

## 2017-08-10 NOTE — ED Triage Notes (Signed)
Pt presents to ED via [facility] with c/o R ankle pain. States on Tuesday jumped into a pool and landed on the bottom, was dx at a hospital in [place] with a R ankle sprain but pain has not been relieved. Pt texting on phone during triage.

## 2017-08-10 NOTE — ED Notes (Signed)
Pt states that she just moved here from [place] and she went swimming, she jumped in the pool hitting the bottom too hard and landed on her right ankle the wrong way.

## 2018-03-21 ENCOUNTER — Emergency Department: Payer: Medicaid Other

## 2018-03-21 ENCOUNTER — Encounter: Payer: Self-pay | Admitting: Emergency Medicine

## 2018-03-21 ENCOUNTER — Emergency Department
Admission: EM | Admit: 2018-03-21 | Discharge: 2018-03-22 | Disposition: A | Discharge status: Home / Self Care | Payer: Medicaid Other | Attending: Emergency Medicine | Admitting: Emergency Medicine

## 2018-03-21 DIAGNOSIS — O99340 Other mental disorders complicating pregnancy, unspecified trimester: Secondary | ICD-10-CM | POA: Diagnosis not present

## 2018-03-21 DIAGNOSIS — Z91040 Latex allergy status: Secondary | ICD-10-CM | POA: Insufficient documentation

## 2018-03-21 DIAGNOSIS — R55 Syncope and collapse: Secondary | ICD-10-CM | POA: Insufficient documentation

## 2018-03-21 DIAGNOSIS — O2340 Unspecified infection of urinary tract in pregnancy, unspecified trimester: Secondary | ICD-10-CM | POA: Diagnosis not present

## 2018-03-21 DIAGNOSIS — O2341 Unspecified infection of urinary tract in pregnancy, first trimester: Secondary | ICD-10-CM

## 2018-03-21 DIAGNOSIS — F909 Attention-deficit hyperactivity disorder, unspecified type: Secondary | ICD-10-CM | POA: Insufficient documentation

## 2018-03-21 DIAGNOSIS — O26819 Pregnancy related exhaustion and fatigue, unspecified trimester: Secondary | ICD-10-CM | POA: Diagnosis present

## 2018-03-21 DIAGNOSIS — F419 Anxiety disorder, unspecified: Secondary | ICD-10-CM | POA: Diagnosis not present

## 2018-03-21 DIAGNOSIS — Z3A00 Weeks of gestation of pregnancy not specified: Secondary | ICD-10-CM | POA: Insufficient documentation

## 2018-03-21 DIAGNOSIS — Z3490 Encounter for supervision of normal pregnancy, unspecified, unspecified trimester: Secondary | ICD-10-CM

## 2018-03-21 LAB — URINALYSIS, COMPLETE (UACMP) WITH MICROSCOPIC
BILIRUBIN URINE: NEGATIVE
GLUCOSE, UA: NEGATIVE mg/dL
HGB URINE DIPSTICK: NEGATIVE
KETONES UR: 5 mg/dL — AB
LEUKOCYTES UA: NEGATIVE
Nitrite: POSITIVE — AB
PROTEIN: 30 mg/dL — AB
Specific Gravity, Urine: 1.026 (ref 1.005–1.030)
pH: 6 (ref 5.0–8.0)

## 2018-03-21 LAB — BASIC METABOLIC PANEL
Anion gap: 6 (ref 5–15)
BUN: 8 mg/dL (ref 6–20)
CHLORIDE: 107 mmol/L (ref 98–111)
CO2: 22 mmol/L (ref 22–32)
Calcium: 10.2 mg/dL (ref 8.9–10.3)
Creatinine, Ser: 0.63 mg/dL (ref 0.44–1.00)
GFR calc Af Amer: >60 mL/min (ref >60)
GFR calc non Af Amer: >60 mL/min (ref >60)
GLUCOSE: 112 mg/dL — AB (ref 70–99)
POTASSIUM: 3.6 mmol/L (ref 3.5–5.1)
Sodium: 135 mmol/L (ref 135–145)

## 2018-03-21 LAB — CBC
HEMATOCRIT: 42.6 % (ref 36.0–46.0)
HEMOGLOBIN: 14.5 g/dL (ref 12.0–15.0)
MCH: 29.9 pg (ref 26.0–34.0)
MCHC: 34 g/dL (ref 30.0–36.0)
MCV: 87.8 fL (ref 80.0–100.0)
Platelets: 203 10*3/uL (ref 150–400)
RBC: 4.85 MIL/uL (ref 3.87–5.11)
RDW: 12.4 % (ref 11.5–15.5)
WBC: 5.7 10*3/uL (ref 4.0–10.5)
nRBC: 0 % (ref 0.0–0.2)

## 2018-03-21 LAB — POCT PREGNANCY, URINE: Preg Test, Ur: POSITIVE — AB

## 2018-03-21 LAB — TROPONIN I: Troponin I: <0.03 ng/mL (ref <0.03)

## 2018-03-21 LAB — HCG, QUANTITATIVE, PREGNANCY: hCG, Beta Chain, Quant, S: 192791 m[IU]/mL — ABNORMAL HIGH (ref <5)

## 2018-03-21 MED ORDER — SODIUM CHLORIDE 0.9% FLUSH: 3.0000 mL | Freq: Once | INTRAVENOUS | Status: DC

## 2018-03-21 NOTE — ED Triage Notes (Addendum)
Patient c/o chest pain, SOB, and LOC today. Patient passed out at church, was informed afterwards she was out for 10 minutes. Patient denies injury from LOC.   Patient also reports that she has been vomiting a lot recently and her period is 1 month late - worried about possibility of pregnancy.

## 2018-03-21 NOTE — ED Notes (Signed)
Reports was in church today and had an asthma attack causing her to black out. Lungs clear b/l. Sating 100% on room air. Awaiting md eval and plan of care.

## 2018-03-22 MED ORDER — CEPHALEXIN 500 MG PO CAPS
500.0000 mg | ORAL_CAPSULE | Freq: Once | ORAL | Status: AC
  Administered 2018-03-22: 500 mg via ORAL
  Filled 2018-03-22: qty 1

## 2018-03-22 MED ORDER — CEPHALEXIN 500 MG PO CAPS: 500.0000 mg | ORAL_CAPSULE | Freq: Two times a day (BID) | ORAL | 0 refills | Status: DC

## 2018-03-22 MED ORDER — DOXYLAMINE-PYRIDOXINE 10-10 MG PO TBEC: DELAYED_RELEASE_TABLET | ORAL | 1 refills | Status: DC

## 2018-03-22 NOTE — ED Notes (Signed)
Patient requesting something to eat, as per Md ok to feed patient. Patient given lunch box and juice, tolerated well.

## 2018-03-22 NOTE — Discharge Instructions (Signed)
Your evaluation was reassuring today.  We did discover that you are in the early stages of pregnancy and need to follow-up at [facility] at the next available opportunity to establish a prenatal provider.  We have provided you with 2 different phone numbers that you can use to schedule an appointment.  We recommend you eat and drink plenty of food and fluids to stay hydrated and to have good nutrition and start taking prenatal vitamins.  I prescribed you a medication for your urinary tract infection (cephalexin, or Keflex) as well as a medication that may help with the nausea and vomiting you have been having (Diclegis).  Please follow-up with the next available opportunity and return to the emergency department if you develop new or worsening symptoms.

## 2018-03-22 NOTE — ED Provider Notes (Signed)
[facility] Emergency Department Provider Note  ____________________________________________   First MD Initiated Contact with Patient 03/21/18 2351     (approximate)  I have reviewed the triage vital signs and the nursing notes.   HISTORY  Chief Complaint Loss of Consciousness and Shortness of Breath    HPI Maureen George is a 22 y.o. female G2P1 who did not know she was pregnant and has one child at home.  She presents by private vehicle for evaluation after passing out earlier tonight. She says that she has asthma and passes out frequently.  She is not at all concerned about what happened and says that this is more or less normal for her.  She denies recent fever/chills, chest pain, nausea, vomiting, abdominal pain, vaginal bleeding, and dysuria.  She says that friends from church convinced her to come in.  She denies any shortness of breath in spite of the reported history of asthma.  She says that she has been eating and drinking recently but she has been vomiting over the last couple of weeks.  She has not had a menstrual period for over a month and she wonders if it is possible she could be pregnant.  She has not had any vaginal bleeding and denies abdominal pain or pelvic pain.  Of note, she says that her blood pressure is always low, she frequently passes out, and that she is always been thin.  Past Medical History:  Diagnosis Date  . ADHD (attention deficit hyperactivity disorder)   . Anxiety     There are no active problems to display for this patient.   Past Surgical History:  Procedure Laterality Date  . WISDOM TOOTH EXTRACTION      Prior to Admission medications   Medication Sig Start Date End Date Taking? Authorizing Provider  cephALEXin (KEFLEX) 500 MG capsule Take 1 capsule (500 mg total) by mouth 2 (two) times daily. 03/22/18   Loleta Rose, MD  Doxylamine-Pyridoxine (DICLEGIS) 10-10 MG TBEC Two tablets at bedtime on day 1 and 2; if  symptoms persist, take 1 tablet in morning and 2 tablets at bedtime on day 3; if symptoms persist, may increase to 1 tablet in morning, 1 tablet mid-afternoon, and 2 tablets at bedtime on day 4 for a maximum of 4 tablets per day. Use the minimum dose necessary to control your symptoms. 03/22/18   Loleta Rose, MD    Allergies Lamictal [lamotrigine]; Latex; and Naproxen  No family history on file.  Social History Social History   Tobacco Use  . Smoking status: Never Smoker  . Smokeless tobacco: Never Used  Substance Use Topics  . Alcohol use: Yes  . Drug use: No    Review of Systems Constitutional: No fever/chills Eyes: No visual changes. ENT: No sore throat. Cardiovascular: Reportedly passed out earlier tonight.  Denies chest pain. Respiratory: Some degree of chronic shortness of breath that she says is secondary to asthma but no shortness of breath today. Gastrointestinal: No abdominal pain.  Intermittent nausea and vomiting for the last couple of weeks..  No diarrhea.  No constipation. Genitourinary: Negative for dysuria.  No menstrual cycle for more than a month, concerned about possible pregnancy.  No vaginal bleeding. Musculoskeletal: Negative for neck pain.  Negative for back pain. Integumentary: Negative for rash. Neurological: Negative for headaches, focal weakness or numbness.   ____________________________________________   PHYSICAL EXAM:  VITAL SIGNS: ED Triage Vitals  Enc Vitals Group     BP 03/21/18 2014 (!) 96/59  Pulse Rate 03/21/18 2014 70     Resp 03/21/18 2014 15     Temp 03/21/18 2014 98 F (36.7 C)     Temp Source 03/21/18 2014 Oral     SpO2 03/21/18 2014 99 %     Weight 03/21/18 2009 49.9 kg (110 lb)     Height 03/21/18 2009 1.676 m (5\' 6" )     Head Circumference --      Peak Flow --      Pain Score 03/21/18 2009 8     Pain Loc --      Pain Edu? --      Excl. in GC? --     Constitutional: Alert and oriented.  Thin body habitus but no  acute distress and generally well-appearing. Eyes: Conjunctivae are normal.  Head: Atraumatic. Nose: No congestion/rhinnorhea. Mouth/Throat: Mucous membranes are moist.  Poor dentition. Neck: No stridor.  No meningeal signs.   Cardiovascular: Normal rate, regular rhythm. Good peripheral circulation. Grossly normal heart sounds. Respiratory: Normal respiratory effort.  No retractions. Lungs CTAB. Gastrointestinal: Soft and nontender. No distention.  Musculoskeletal: No lower extremity tenderness nor edema. No gross deformities of extremities. Neurologic:  Normal speech and language. No gross focal neurologic deficits are appreciated.  Skin:  Skin is warm, dry and intact. No rash noted. Psychiatric: Mood and affect are normal. Speech and behavior are normal.  ____________________________________________   LABS (all labs ordered are listed, but only abnormal results are displayed)  Labs Reviewed  BASIC METABOLIC PANEL - Abnormal; Notable for the following components:      Result Value   Glucose, Bld 112 (*)    All other components within normal limits  URINALYSIS, COMPLETE (UACMP) WITH MICROSCOPIC - Abnormal; Notable for the following components:   Color, Urine AMBER (*)    APPearance HAZY (*)    Ketones, ur 5 (*)    Protein, ur 30 (*)    Nitrite POSITIVE (*)    Bacteria, UA MANY (*)    All other components within normal limits  HCG, QUANTITATIVE, PREGNANCY - Abnormal; Notable for the following components:   hCG, Beta Chain, Quant, S 192,791 (*)    All other components within normal limits  POCT PREGNANCY, URINE - Abnormal; Notable for the following components:   Preg Test, Ur POSITIVE (*)    All other components within normal limits  URINE CULTURE  CBC  TROPONIN I  POC URINE PREG, ED   ____________________________________________  EKG  ED ECG REPORT I, Loleta Roseory Nakeysha Pasqual, the attending physician, personally viewed and interpreted this ECG.  Date: 03/21/2018 EKG Time: 20:  08 Rate: 62 Rhythm: normal sinus rhythm QRS Axis: normal Intervals: normal ST/T Wave abnormalities: Non-specific ST segment / T-wave changes, but no clear evidence of acute ischemia. Narrative Interpretation: no definitive evidence of acute ischemia; does not meet STEMI criteria.   ____________________________________________  RADIOLOGY  ED MD interpretation: No indication for chest x-ray  Official radiology report(s): No results found.  ____________________________________________   PROCEDURES  Critical Care performed: No   Procedure(s) performed:   Procedures   ____________________________________________   INITIAL IMPRESSION / ASSESSMENT AND PLAN / ED COURSE  As part of my medical decision making, I reviewed the following data within the electronic MEDICAL RECORD NUMBER Nursing notes reviewed and incorporated, Labs reviewed , EKG interpreted , Old chart reviewed and Notes from prior ED visits    Differential diagnosis includes, but is not limited to, volume depletion secondary to decreased oral intake or recent nausea/vomiting leading  to orthostasis and syncope, cardiogenic syncope, pulmonary embolism, acute internal hemorrhage leading to hypotension and anemia, pregnancy, acute infection.  The patient is generally well-appearing, I am concerned about her symptoms, and asymptomatic at this time.  She states that all of the things I mentioned as a possible concern (low blood pressure, passing out, intermittent shortness of breath, etc.) are normal for her and she is not worried about them.  Given her thin habitus and poor dentition, I mention to her about her being thin and she says this is normal.  I suspect it may be possible that she could have an eating disorder but she is not volunteering any information and has capacity to make her own decisions and I will not pursue this line of questioning any further.  Her medical work-up is generally reassuring.  She has a normal  basic metabolic panel including electrolytes.  CBC is within normal limits including a normal hemoglobin and no leukocytosis.  Her urinalysis is nitrite positive and consistent with infection and I have sent off a urine culture.  She has a few ketones in her urine likely reflective of decreased oral intake and/or vomiting.  Urine pregnancy test is positive with a beta hCG of greater than 192,000.  Troponin is negative.  EKG is nonspecific but generally reassuring.  The patient asked for something to eat and ate a meal tray.  She is taking good oral intake and not vomiting at this time.  [place] and I encouraged her to [facility].  She is comfortable not pursuing any additional work-up at this time.  I do not feel she would benefit from imaging and there is nothing to make me believe she has had a pulmonary embolism, and I feel that the risk of a CTA chest to her pregnancy would be much greater than any possible benefit or any risk that she has a PE.  I encourage close outpatient follow-up and prescribed antibiotics for her UTI.  She will establish prenatal care at the next available opportunity.  I gave my usual and customary return precautions.     ____________________________________________  FINAL CLINICAL IMPRESSION(S) / ED DIAGNOSES  Final diagnoses:  Syncope and collapse  Early stage of pregnancy  UTI (urinary tract infection) during pregnancy, first trimester     MEDICATIONS GIVEN DURING THIS VISIT:  Medications  sodium chloride flush (NS) 0.9 % injection 3 mL (has no administration in time range)  cephALEXin (KEFLEX) capsule 500 mg (500 mg Oral Given 03/22/18 0139)     ED Discharge Orders         Ordered    cephALEXin (KEFLEX) 500 MG capsule  2 times daily     03/22/18 0140    Doxylamine-Pyridoxine (DICLEGIS) 10-10 MG TBEC     03/22/18 0140           Note:  This document was prepared using Dragon voice recognition  software and may include unintentional dictation errors.    Loleta RoseForbach, Aijah Lattner, MD 03/22/18 [phone]

## 2018-03-22 NOTE — ED Notes (Signed)
Po meds given for positive UTI.

## 2018-03-24 LAB — URINE CULTURE
Culture: >=100000 — AB
Special Requests: NORMAL

## 2018-05-10 DIAGNOSIS — J45909 Unspecified asthma, uncomplicated: Secondary | ICD-10-CM | POA: Diagnosis present

## 2019-03-08 DIAGNOSIS — F411 Generalized anxiety disorder: Secondary | ICD-10-CM | POA: Diagnosis present

## 2019-12-30 IMAGING — DX DG ANKLE COMPLETE 3+V*R*
3 series · 3 of 3 positions shown · non-contrast
Comparison: Right lower leg dated 09/03/2015.

CLINICAL DATA: Right ankle pain after jumping into a pool and
landing on the bottom 3 days ago.

EXAM:
RIGHT ANKLE - COMPLETE 3+ VIEW

[ankle ap]
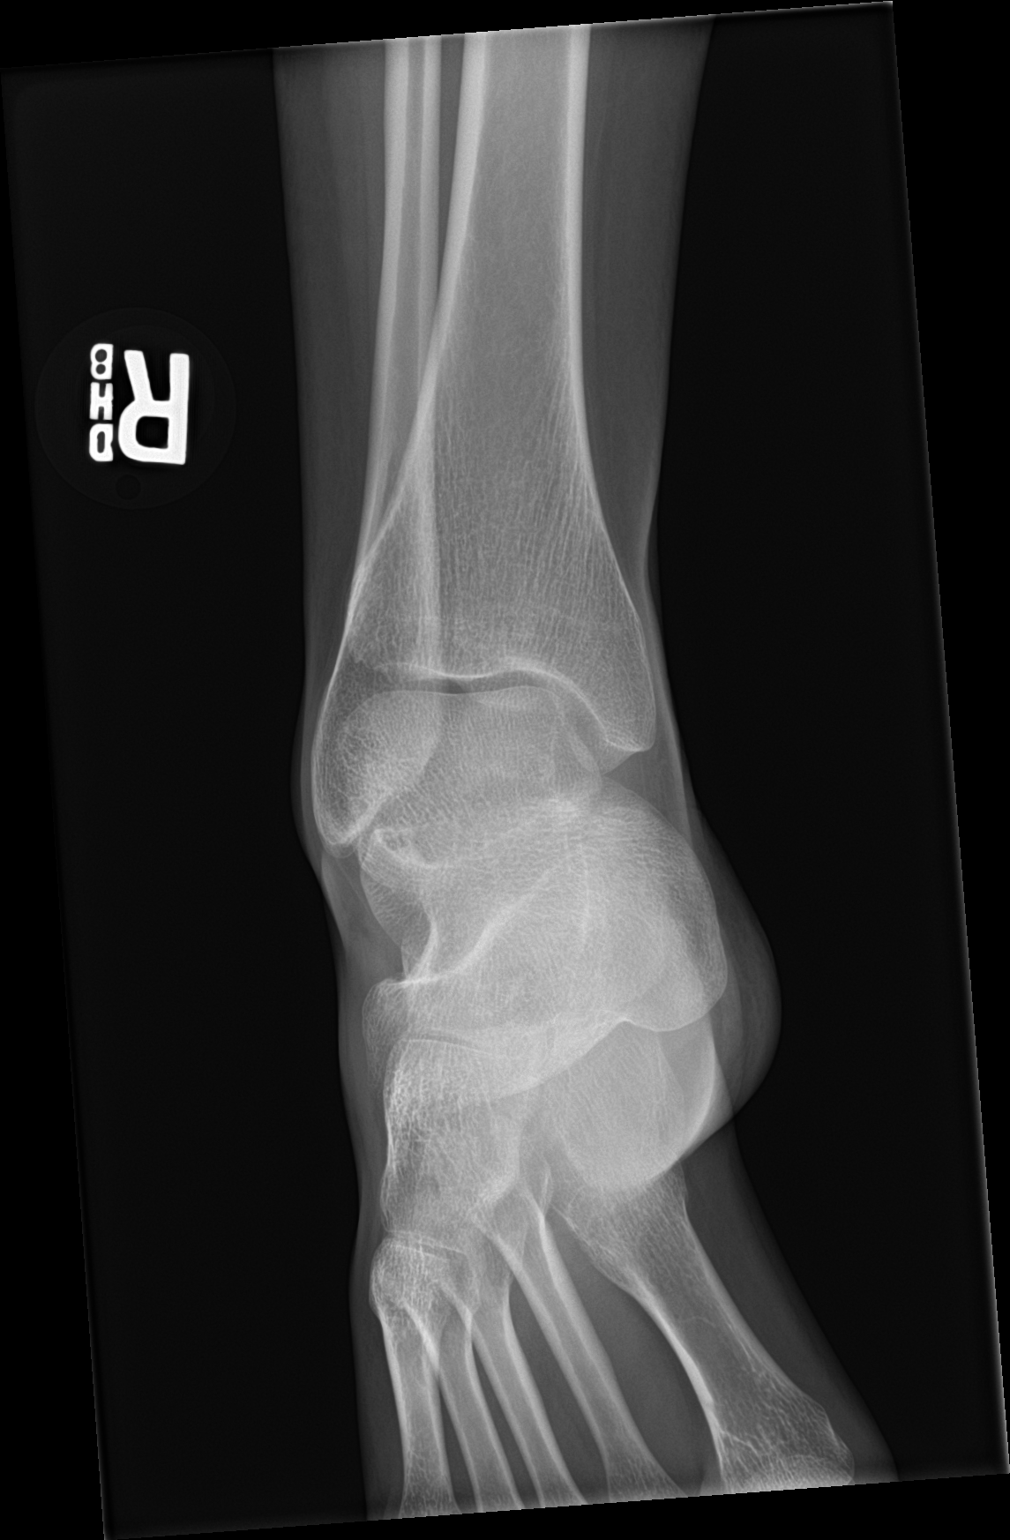

[ankle obl]
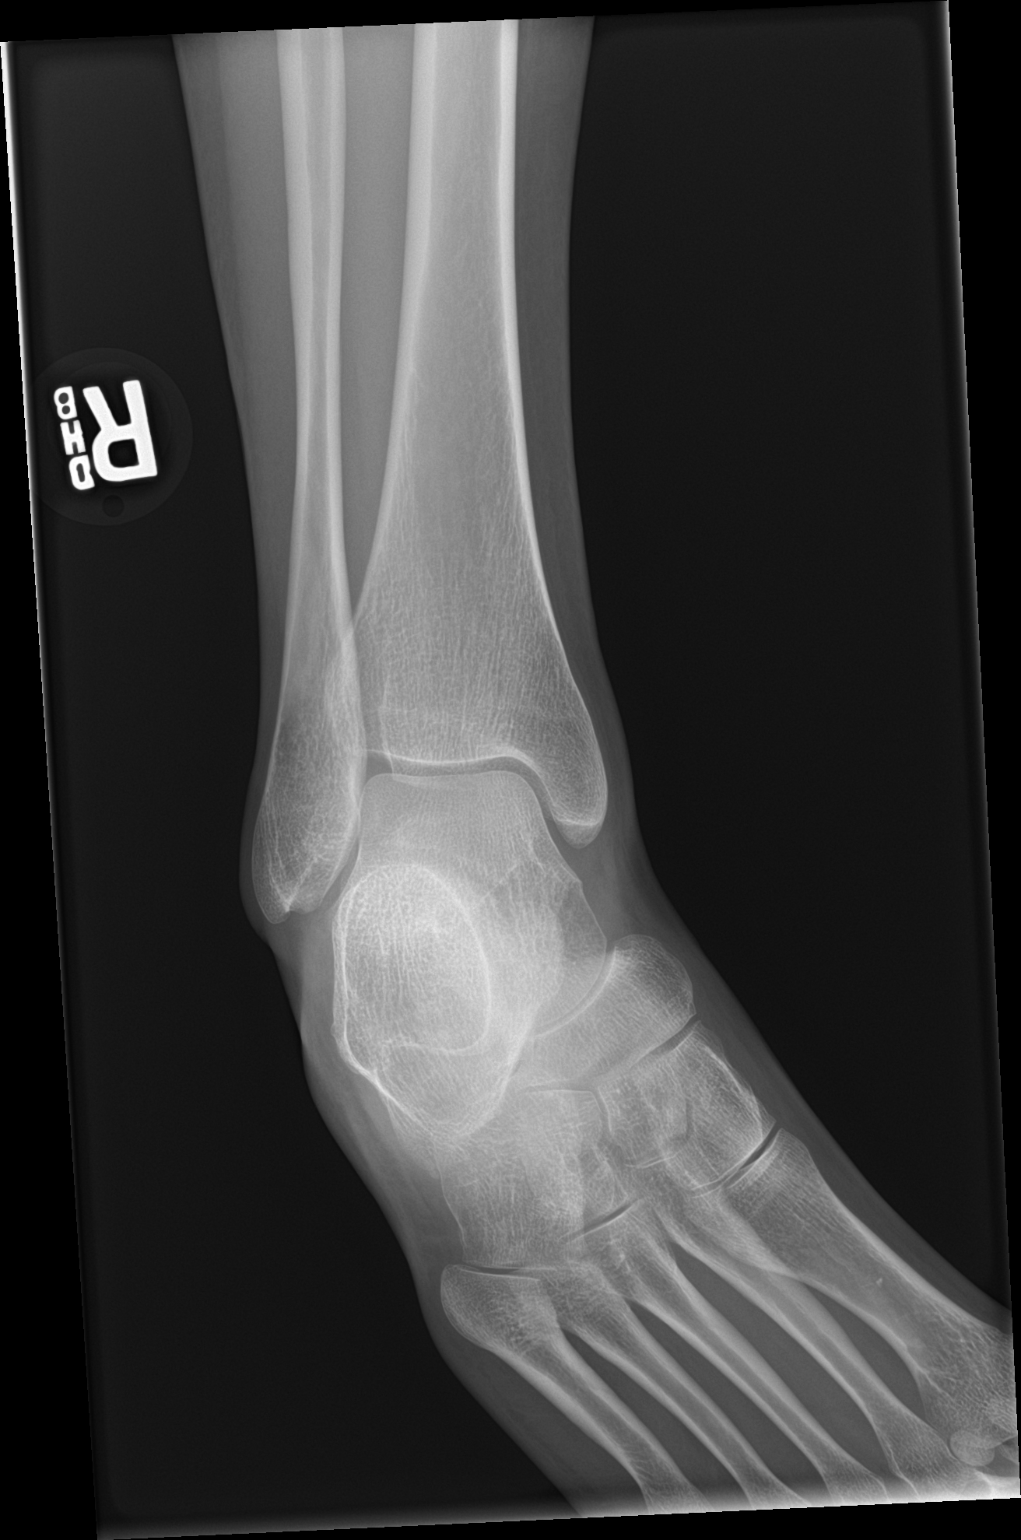

[ankle lat]
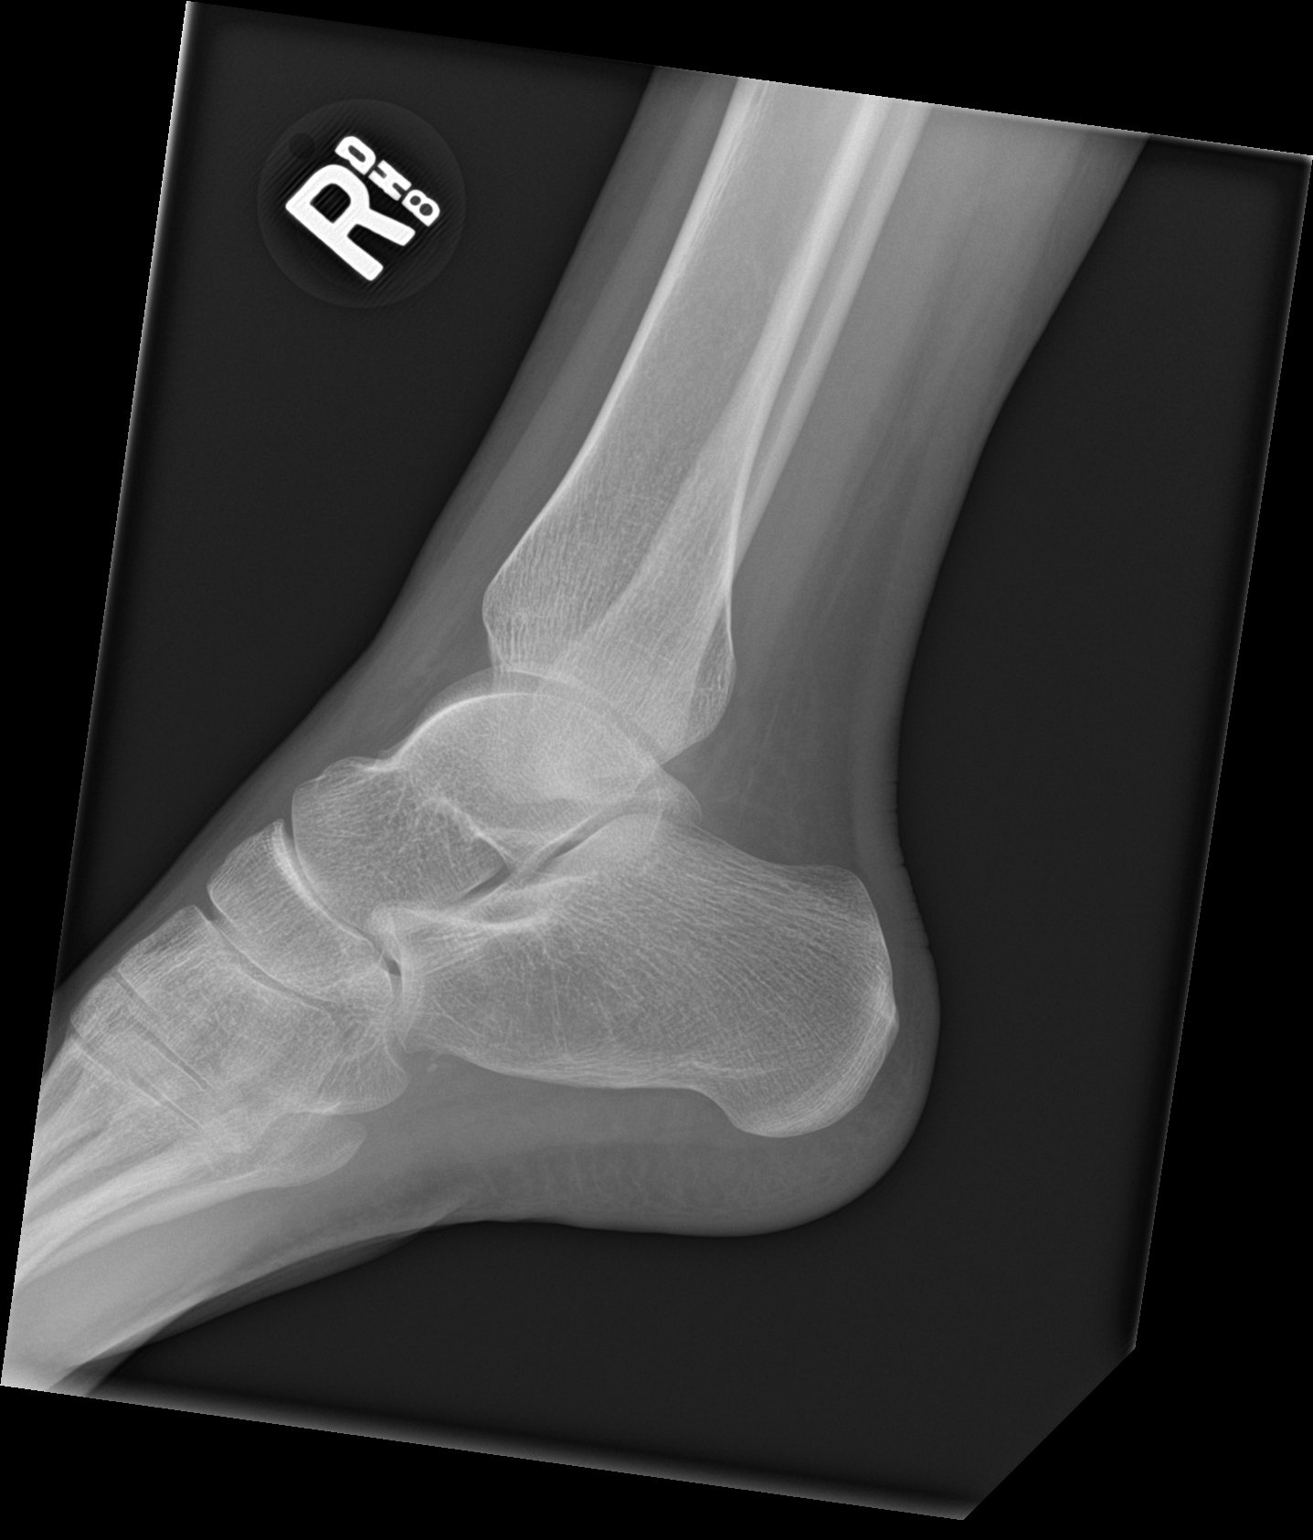

[3 of 3 positions shown; findings below may reference images not displayed]

FINDINGS: There is no evidence of fracture, dislocation, or joint effusion.
There is no evidence of arthropathy or other focal bone abnormality.
Soft tissues are unremarkable.
IMPRESSION: Normal examination.

## 2021-12-06 ENCOUNTER — Other Ambulatory Visit: Payer: Self-pay

## 2021-12-06 ENCOUNTER — Encounter: Payer: Self-pay | Admitting: Emergency Medicine

## 2021-12-06 ENCOUNTER — Emergency Department
Admission: EM | Admit: 2021-12-06 | Discharge: 2021-12-06 | Disposition: A | Discharge status: Home / Self Care | Payer: Self-pay | Attending: Emergency Medicine | Admitting: Emergency Medicine

## 2021-12-06 ENCOUNTER — Emergency Department: Payer: Self-pay

## 2021-12-06 DIAGNOSIS — R079 Chest pain, unspecified: Secondary | ICD-10-CM

## 2021-12-06 DIAGNOSIS — R0789 Other chest pain: Secondary | ICD-10-CM | POA: Insufficient documentation

## 2021-12-06 DIAGNOSIS — R569 Unspecified convulsions: Secondary | ICD-10-CM | POA: Insufficient documentation

## 2021-12-06 LAB — BASIC METABOLIC PANEL
Anion gap: 5 (ref 5–15)
BUN: 15 mg/dL (ref 6–20)
CO2: 24 mmol/L (ref 22–32)
Calcium: 10.6 mg/dL — ABNORMAL HIGH (ref 8.9–10.3)
Chloride: 111 mmol/L (ref 98–111)
Creatinine, Ser: 0.76 mg/dL (ref 0.44–1.00)
GFR, Estimated: >60 mL/min (ref >60)
Glucose, Bld: 102 mg/dL — ABNORMAL HIGH (ref 70–99)
Potassium: 4.4 mmol/L (ref 3.5–5.1)
Sodium: 140 mmol/L (ref 135–145)

## 2021-12-06 LAB — CBC
HCT: 40 % (ref 36.0–46.0)
Hemoglobin: 13 g/dL (ref 12.0–15.0)
MCH: 28.6 pg (ref 26.0–34.0)
MCHC: 32.5 g/dL (ref 30.0–36.0)
MCV: 87.9 fL (ref 80.0–100.0)
Platelets: 238 10*3/uL (ref 150–400)
RBC: 4.55 MIL/uL (ref 3.87–5.11)
RDW: 14.6 % (ref 11.5–15.5)
WBC: 6.5 10*3/uL (ref 4.0–10.5)
nRBC: 0 % (ref 0.0–0.2)

## 2021-12-06 LAB — TROPONIN I (HIGH SENSITIVITY): Troponin I (High Sensitivity): 3 ng/L (ref <18)

## 2021-12-06 LAB — POC URINE PREG, ED: Preg Test, Ur: NEGATIVE

## 2021-12-06 NOTE — Discharge Instructions (Addendum)
Please call the number provided for neurology to arrange a follow-up appointment soon as possible.  Return to the emergency department for any symptom personally concerning to yourself.

## 2021-12-06 NOTE — ED Triage Notes (Signed)
Per ems pt with "normal ekg" chest pain that began this afternoon. Ems gave asa 324mg . Pt states was at rest when she began to have central chest pain, felt near syncopal. Pt denies nausea, shob.

## 2021-12-06 NOTE — ED Notes (Signed)
This tech did not feel confident enough to draw blood. X-Ray came to get pt immediately after triage, so pt STILL NEEDS LABS

## 2021-12-06 NOTE — ED Provider Notes (Signed)
[facility] Provider Note    Event Date/Time   First MD Initiated Contact with Patient 12/06/21 1357     (approximate)  History   Chief Complaint: Chest Pain  HPI  Maureen George is a 26 y.o. female with a past medical history anxiety, ADHD, presents to the emergency department for chest pain and a seizure-like episode.  According to the patient she has frequent seizure activity.  States she used to be on seizure medication but her doctor took her off of it due to an allergy per patient.  States she has not followed up with a neurologist recently and has not being treated.  She states today she had an episode in which she had seizure-like activity but states she was conscious throughout the episode.  Patient states following the episode she was having some chest discomfort which she states is typical following her seizures.  Currently denies any symptoms.  States she feels normal and denies any chest pain.  Patient is requesting a pregnancy test as she states she is a few days late on her menstrual cycle.   Physical Exam   Triage Vital Signs: ED Triage Vitals [12/06/21 1252]  Enc Vitals Group     BP 114/63     Pulse Rate 95     Resp 16     Temp 98.3 F (36.8 C)     Temp Source Oral     SpO2 100 %     Weight 111 lb (50.3 kg)     Height 5\' 6"  (1.676 m)     Head Circumference      Peak Flow      Pain Score 4     Pain Loc      Pain Edu?      Excl. in GC?     Most recent vital signs: Vitals:   12/06/21 1252  BP: 114/63  Pulse: 95  Resp: 16  Temp: 98.3 F (36.8 C)  SpO2: 100%    General: Awake, no distress.  CV:  Good peripheral perfusion.  Regular rate and rhythm  Resp:  Normal effort.  Equal breath sounds bilaterally.  Abd:  No distention.  Soft, nontender.  No rebound or guarding.   ED Results / Procedures / Treatments   EKG  EKG viewed and interpreted by myself shows normal sinus rhythm 87 bpm with a narrow QRS, normal axis, normal  intervals, no concerning ST changes.  RADIOLOGY  I have reviewed and interpreted the chest x-ray images.  There is no consolidation on my evaluation. Radiology is read the chest x-ray is negative   MEDICATIONS ORDERED IN ED: Medications - No data to display   IMPRESSION / MDM / ASSESSMENT AND PLAN / ED COURSE  I reviewed the triage vital signs and the nursing notes.  Patient's presentation is most consistent with acute presentation with potential threat to life or bodily function.  Patient presents emergency department for chest discomfort after a seizure.  According to the patient she was awake throughout the seizure she remains conscious.  Possibly indicating more seizure-like activity or pseudoseizure activity.  Patient states she used to be on antiepileptics and is no longer on any seizure medications.  Patient does not have a neurologist with whom she follows.  The patient's symptoms we will refer to Dr. 02/05/22 of neurology for further evaluation.  Patient's labs today in the emergency department show a normal CBC, reassuring chemistry and a negative troponin.  Chest x-ray is clear and  EKG is reassuring.  Patient has no symptoms and is asking to be discharged home.  She did say that she is late on her menstrual cycle and requested a urine pregnancy test.  We will perform a pregnancy test if negative patient will be discharged with PCP and neurology follow-up.  FINAL CLINICAL IMPRESSION(S) / ED DIAGNOSES   Seizure-like activity Chest pain   Note:  This document was prepared using Dragon voice recognition software and may include unintentional dictation errors.   Harvest Dark, MD 12/06/21 1439

## 2021-12-06 NOTE — ED Notes (Signed)
Lab called for labs

## 2022-02-10 ENCOUNTER — Other Ambulatory Visit: Payer: Self-pay

## 2022-02-10 ENCOUNTER — Encounter: Payer: Self-pay | Admitting: Medical Oncology

## 2022-02-10 ENCOUNTER — Emergency Department
Admission: EM | Admit: 2022-02-10 | Discharge: 2022-02-10 | Disposition: A | Discharge status: Home / Self Care | Payer: Medicaid Other | Attending: Emergency Medicine | Admitting: Emergency Medicine

## 2022-02-10 DIAGNOSIS — R569 Unspecified convulsions: Secondary | ICD-10-CM | POA: Diagnosis present

## 2022-02-10 HISTORY — DX: Unspecified convulsions: R56.9

## 2022-02-10 LAB — BASIC METABOLIC PANEL
Anion gap: 4 — ABNORMAL LOW (ref 5–15)
BUN: 11 mg/dL (ref 6–20)
CO2: 23 mmol/L (ref 22–32)
Calcium: 9.7 mg/dL (ref 8.9–10.3)
Chloride: 116 mmol/L — ABNORMAL HIGH (ref 98–111)
Creatinine, Ser: 0.67 mg/dL (ref 0.44–1.00)
GFR, Estimated: >60 mL/min (ref >60)
Glucose, Bld: 96 mg/dL (ref 70–99)
Potassium: 3.7 mmol/L (ref 3.5–5.1)
Sodium: 143 mmol/L (ref 135–145)

## 2022-02-10 LAB — CBC
HCT: 38.4 % (ref 36.0–46.0)
Hemoglobin: 12.4 g/dL (ref 12.0–15.0)
MCH: 28.7 pg (ref 26.0–34.0)
MCHC: 32.3 g/dL (ref 30.0–36.0)
MCV: 88.9 fL (ref 80.0–100.0)
Platelets: 201 10*3/uL (ref 150–400)
RBC: 4.32 MIL/uL (ref 3.87–5.11)
RDW: 14.2 % (ref 11.5–15.5)
WBC: 4.2 10*3/uL (ref 4.0–10.5)
nRBC: 0 % (ref 0.0–0.2)

## 2022-02-10 LAB — POC URINE PREG, ED: Preg Test, Ur: NEGATIVE

## 2022-02-10 MED ORDER — LORAZEPAM 1 MG PO TABS: 1.0000 mg | ORAL_TABLET | Freq: Every day | ORAL | 0 refills | Status: DC | PRN

## 2022-02-10 MED ORDER — LORAZEPAM 1 MG PO TABS: 1.0000 mg | ORAL_TABLET | Freq: Every day | ORAL | 0 refills | Status: AC | PRN

## 2022-02-10 MED ORDER — ACETAMINOPHEN 500 MG PO TABS
1000.0000 mg | ORAL_TABLET | Freq: Once | ORAL | Status: AC
  Administered 2022-02-10: 1000 mg via ORAL
  Filled 2022-02-10: qty 2

## 2022-02-10 MED ORDER — LORAZEPAM 1 MG PO TABS
1.0000 mg | ORAL_TABLET | Freq: Once | ORAL | Status: AC
  Administered 2022-02-10: 1 mg via ORAL
  Filled 2022-02-10: qty 1

## 2022-02-10 NOTE — ED Triage Notes (Signed)
Pt here from home via ems with reports that pt had 5 seizures this morning, last one was 2hrs pta. Per pt last seizure was in October. Was placed on lamictal but is allergic so has not been taking meds, pt reports [facility] neuro appt next week.

## 2022-02-10 NOTE — ED Provider Notes (Signed)
[facility] Provider Note    Event Date/Time   First MD Initiated Contact with Patient 02/10/22 1100     (approximate)  History   Chief Complaint: Seizures  HPI  Maureen George is a 26 y.o. female with a past medical history of ADHD, anxiety, presents the emergency department for seizure-like activity.  According to the patient this morning she has had for 5 episodes of seizures.  She describes these episodes as generalized shaking of both of her arms and legs however she states she is awake and alert throughout the events and is aware that they are going on.  States most events last anywhere between 2 and 15 minutes.  Patient states it feels like she cannot respond during these episodes but she is awake alert and is aware of what is going on around her.  Patient has an appointment on 02/14/2022 with Dr. Malvin Johns of neurology for further workup.  Is not on any antiepileptic medications was prescribed Lamictal 1 point but was allergic and taken off this medicine.  Patient does state a history of anxiety but denies any significant stressors this morning.  Denies any medical complaints such as abdominal pain chest pain fever cough congestion nausea vomiting or diarrhea.  Physical Exam   Triage Vital Signs: ED Triage Vitals  Enc Vitals Group     BP 02/10/22 0945 101/60     Pulse Rate 02/10/22 0945 85     Resp 02/10/22 0945 18     Temp 02/10/22 0945 98 F (36.7 C)     Temp Source 02/10/22 0945 Oral     SpO2 02/10/22 0945 98 %     Weight 02/10/22 0944 110 lb (49.9 kg)     Height 02/10/22 0944 5\' 6"  (1.676 m)     Head Circumference --      Peak Flow --      Pain Score 02/10/22 0948 0     Pain Loc --      Pain Edu? --      Excl. in GC? --     Most recent vital signs: Vitals:   02/10/22 0945  BP: 101/60  Pulse: 85  Resp: 18  Temp: 98 F (36.7 C)  SpO2: 98%    General: Awake, no distress.  CV:  Good peripheral perfusion.  Regular rate and rhythm   Resp:  Normal effort.  Equal breath sounds bilaterally.  Abd:  No distention.  Soft, nontender.  No rebound or guarding.   ED Results / Procedures / Treatments    MEDICATIONS ORDERED IN ED: Medications  LORazepam (ATIVAN) tablet 1 mg (has no administration in time range)     IMPRESSION / MDM / ASSESSMENT AND PLAN / ED COURSE  I reviewed the triage vital signs and the nursing notes.  Patient's presentation is most consistent with acute presentation with potential threat to life or bodily function.  Patient presents emergency department for seizure-like activity at home.  Patient's description of the episodes do not seem consistent with a tonic-clonic seizure as the patient states she remains awake and alert throughout the event and symptoms occur in both sides of her body.  Does have a history of anxiety, attempted to take hydroxyzine this morning without relief.  I would be reluctant to place the patient on antiepileptic medications before she sees neurology given the description does not quite fit a tonic-clonic seizure.  We will dose 1 mg of Ativan orally.  Patient's workup in the emergency department is  reassuring including CBC chemistry negative pregnancy test.  We will have the patient follow-up with her doctor and Dr. Malvin Johns.  Discussed with the patient not to drink alcohol or drive while taking Ativan.  Patient agreeable to plan.  FINAL CLINICAL IMPRESSION(S) / ED DIAGNOSES   Seizure-like activity  Rx / DC Orders   Ativan x 5 tablets  Note:  This document was prepared using Dragon voice recognition software and may include unintentional dictation errors.   Minna Antis, MD 02/10/22 1121

## 2022-02-10 NOTE — Discharge Instructions (Signed)
Please follow-up with Dr. Malvin Johns as scheduled on the 12th.  Please call your doctor and to inform them of today's ER visit and your seizure activity.  As we discussed you have been prescribed a medication called Ativan which can be used if you are having seizure-like activity.  Do not drink alcohol while taking this medication.  Do not drive until you have been cleared by Dr. Malvin Johns to do so.  Return to the emergency department for any symptom concerning to yourself.

## 2022-03-05 ENCOUNTER — Emergency Department
Admission: EM | Admit: 2022-03-05 | Discharge: 2022-03-05 | Disposition: A | Discharge status: Home / Self Care | Payer: Medicaid Other | Attending: Emergency Medicine | Admitting: Emergency Medicine

## 2022-03-05 ENCOUNTER — Other Ambulatory Visit: Payer: Self-pay

## 2022-03-05 ENCOUNTER — Emergency Department: Payer: Medicaid Other

## 2022-03-05 DIAGNOSIS — R1013 Epigastric pain: Secondary | ICD-10-CM | POA: Diagnosis not present

## 2022-03-05 DIAGNOSIS — R079 Chest pain, unspecified: Secondary | ICD-10-CM | POA: Diagnosis present

## 2022-03-05 LAB — BASIC METABOLIC PANEL
Anion gap: 5 (ref 5–15)
BUN: 11 mg/dL (ref 6–20)
CO2: 24 mmol/L (ref 22–32)
Calcium: 10 mg/dL (ref 8.9–10.3)
Chloride: 114 mmol/L — ABNORMAL HIGH (ref 98–111)
Creatinine, Ser: 0.86 mg/dL (ref 0.44–1.00)
GFR, Estimated: >60 mL/min (ref >60)
Glucose, Bld: 91 mg/dL (ref 70–99)
Potassium: 3.2 mmol/L — ABNORMAL LOW (ref 3.5–5.1)
Sodium: 143 mmol/L (ref 135–145)

## 2022-03-05 LAB — CBC
HCT: 38.9 % (ref 36.0–46.0)
Hemoglobin: 13 g/dL (ref 12.0–15.0)
MCH: 29.5 pg (ref 26.0–34.0)
MCHC: 33.4 g/dL (ref 30.0–36.0)
MCV: 88.2 fL (ref 80.0–100.0)
Platelets: 211 10*3/uL (ref 150–400)
RBC: 4.41 MIL/uL (ref 3.87–5.11)
RDW: 13.8 % (ref 11.5–15.5)
WBC: 4.5 10*3/uL (ref 4.0–10.5)
nRBC: 0 % (ref 0.0–0.2)

## 2022-03-05 LAB — TROPONIN I (HIGH SENSITIVITY): Troponin I (High Sensitivity): 4 ng/L (ref <18)

## 2022-03-05 MED ORDER — POTASSIUM CHLORIDE CRYS ER 20 MEQ PO TBCR
40.0000 meq | EXTENDED_RELEASE_TABLET | Freq: Once | ORAL | Status: AC
  Administered 2022-03-05: 40 meq via ORAL
  Filled 2022-03-05: qty 2

## 2022-03-05 NOTE — ED Triage Notes (Signed)
First Nurse Note;  Pt via EMS from [place]. Pt c/o sharp, centralized chest pain since last night. Denies radiating. Pt report a heart block in Feb. Pt states she has been taking her medication as prescribed. Initial EKG was NSR but then dropped to SB then came back up to NSR. Pt is A&Ox4 and NAD 100/60 BP  118 CBG  100% on RA  20 G L forearm, EMS gave of fluid.

## 2022-03-05 NOTE — Discharge Instructions (Signed)
Chest pain returns after eating, please take Pepcid, 40 mg. Pepcid is an over-the-counter medication.

## 2022-03-05 NOTE — ED Provider Notes (Signed)
[facility] Provider Note  Patient Contact: 4:58 PM (approximate)   History   Chest Pain   HPI  Maureen George is a 26 y.o. female with a history of anxiety, ADHD and seizures, presents to the emergency department with resolved chest pain.  Patient states that she developed epigastric abdominal pain/chest discomfort after having a chicken sandwich with mayonnaise last night.  Patient states that she felt discomfort almost immediately but then tried to lay down and felt the discomfort worsened.  She denies nausea and vomiting.  No chest tightness or shortness of breath.  She reports the pain seemed to resolve on its own.  No abdominal pain or fever.  Patient denies experiencing similar discomfort in the past.      Physical Exam   Triage Vital Signs: ED Triage Vitals  Enc Vitals Group     BP 03/05/22 1633 101/70     Pulse Rate 03/05/22 1633 (!) 53     Resp 03/05/22 1633 14     Temp 03/05/22 1640 98.1 F (36.7 C)     Temp Source 03/05/22 1633 Oral     SpO2 03/05/22 1633 100 %     Weight 03/05/22 1640 112 lb (50.8 kg)     Height 03/05/22 1640 5\' 6"  (1.676 m)     Head Circumference --      Peak Flow --      Pain Score 03/05/22 1640 9     Pain Loc --      Pain Edu? --      Excl. in GC? --     Most recent vital signs: Vitals:   03/05/22 1640 03/05/22 1648  BP:  96/70  Pulse:    Resp:    Temp: 98.1 F (36.7 C)   SpO2:       General: Alert and in no acute distress. Eyes:  PERRL. EOMI. Head: No acute traumatic findings ENT:      Nose: No congestion/rhinnorhea.      Mouth/Throat: Mucous membranes are moist. Neck: No stridor. No cervical spine tenderness to palpation. Cardiovascular:  Good peripheral perfusion Respiratory: Normal respiratory effort without tachypnea or retractions. Lungs CTAB. Good air entry to the bases with no decreased or absent breath sounds. Gastrointestinal: Bowel sounds 4 quadrants. Soft and nontender to palpation. No  guarding or rigidity. No palpable masses. No distention. No CVA tenderness. Musculoskeletal: Full range of motion to all extremities.  Neurologic:  No gross focal neurologic deficits are appreciated.  Skin:   No rash noted    ED Results / Procedures / Treatments   Labs (all labs ordered are listed, but only abnormal results are displayed) Labs Reviewed  BASIC METABOLIC PANEL - Abnormal; Notable for the following components:      Result Value   Potassium 3.2 (*)    Chloride 114 (*)    All other components within normal limits  CBC  POC URINE PREG, ED  TROPONIN I (HIGH SENSITIVITY)     EKG  Normal sinus rhythm with sinus arrhythmia   RADIOLOGY  I personally viewed and evaluated these images as part of my medical decision making, as well as reviewing the written report by the radiologist.  ED Provider Interpretation: No acute abnormality was visualized.   PROCEDURES:  Critical Care performed: No  Procedures   MEDICATIONS ORDERED IN ED: Medications  potassium chloride SA (KLOR-CON M) CR tablet 40 mEq (40 mEq Oral Given 03/05/22 1748)     IMPRESSION / MDM / ASSESSMENT  AND PLAN / ED COURSE  I reviewed the triage vital signs and the nursing notes.                              Assessment and plan Resolved chest pain 26 year old female with past medical history detailed above, presents to the emergency department with resolved chest pain.  Patient was bradycardic at triage but vital signs otherwise reassuring.  Patient had mild hypokalemia on her BMP without other notable findings.  CBC within reference range.  Troponin within reference range.  Chest x-ray unremarkable.  EKG indicates normal sinus rhythm with sinus arrhythmia.  Patient given 40 mEq of potassium chloride for mild hypokalemia.  Suspect acid reflux as a source of patient's chest discomfort given historical information.  Return precautions were given to return with new or worsening symptoms.  All patient  questions were answered.   FINAL CLINICAL IMPRESSION(S) / ED DIAGNOSES   Final diagnoses:  Chest pain, unspecified type     Rx / DC Orders   ED Discharge Orders     None        Note:  This document was prepared using Dragon voice recognition software and may include unintentional dictation errors.   Pia Mau [place], PA-C 03/05/22 Mariea Clonts, MD 03/05/22 [phone]

## 2022-03-05 NOTE — ED Triage Notes (Signed)
Pt to ED [facility] from [place]. See first nurse note. Pt complains of sharp stabbing CP that started last night. Pt has unlabored respirations. Complains of 9/10 pain. Appears to be in NAD.

## 2022-03-06 DIAGNOSIS — Z8679 Personal history of other diseases of the circulatory system: Secondary | ICD-10-CM

## 2022-03-06 HISTORY — DX: Personal history of other diseases of the circulatory system: Z86.79

## 2022-03-13 ENCOUNTER — Encounter: Payer: Self-pay | Admitting: Emergency Medicine

## 2022-03-13 DIAGNOSIS — R569 Unspecified convulsions: Secondary | ICD-10-CM | POA: Diagnosis present

## 2022-03-13 DIAGNOSIS — G40909 Epilepsy, unspecified, not intractable, without status epilepticus: Secondary | ICD-10-CM | POA: Insufficient documentation

## 2022-03-13 LAB — CBC WITH DIFFERENTIAL/PLATELET
Abs Immature Granulocytes: 0.01 10*3/uL (ref 0.00–0.07)
Basophils Absolute: 0.1 10*3/uL (ref 0.0–0.1)
Basophils Relative: 1 %
Eosinophils Absolute: 0.1 10*3/uL (ref 0.0–0.5)
Eosinophils Relative: 1 %
HCT: 36.6 % (ref 36.0–46.0)
Hemoglobin: 11.9 g/dL — ABNORMAL LOW (ref 12.0–15.0)
Immature Granulocytes: 0 %
Lymphocytes Relative: 30 %
Lymphs Abs: 2.1 10*3/uL (ref 0.7–4.0)
MCH: 29 pg (ref 26.0–34.0)
MCHC: 32.5 g/dL (ref 30.0–36.0)
MCV: 89.3 fL (ref 80.0–100.0)
Monocytes Absolute: 0.3 10*3/uL (ref 0.1–1.0)
Monocytes Relative: 5 %
Neutro Abs: 4.3 10*3/uL (ref 1.7–7.7)
Neutrophils Relative %: 63 %
Platelets: 201 10*3/uL (ref 150–400)
RBC: 4.1 MIL/uL (ref 3.87–5.11)
RDW: 14.1 % (ref 11.5–15.5)
WBC: 6.8 10*3/uL (ref 4.0–10.5)
nRBC: 0 % (ref 0.0–0.2)

## 2022-03-13 LAB — CBG MONITORING, ED: Glucose-Capillary: 94 mg/dL (ref 70–99)

## 2022-03-13 LAB — URINALYSIS, ROUTINE W REFLEX MICROSCOPIC
Bilirubin Urine: NEGATIVE
Glucose, UA: NEGATIVE mg/dL
Hgb urine dipstick: NEGATIVE
Ketones, ur: 20 mg/dL — AB
Leukocytes,Ua: NEGATIVE
Nitrite: NEGATIVE
Protein, ur: 30 mg/dL — AB
Specific Gravity, Urine: 1.028 (ref 1.005–1.030)
pH: 5 (ref 5.0–8.0)

## 2022-03-13 LAB — POC URINE PREG, ED: Preg Test, Ur: NEGATIVE

## 2022-03-13 LAB — COMPREHENSIVE METABOLIC PANEL
ALT: 20 U/L (ref 0–44)
AST: 25 U/L (ref 15–41)
Albumin: 3.9 g/dL (ref 3.5–5.0)
Alkaline Phosphatase: 56 U/L (ref 38–126)
Anion gap: 6 (ref 5–15)
BUN: 10 mg/dL (ref 6–20)
CO2: 23 mmol/L (ref 22–32)
Calcium: 9.8 mg/dL (ref 8.9–10.3)
Chloride: 110 mmol/L (ref 98–111)
Creatinine, Ser: 0.72 mg/dL (ref 0.44–1.00)
GFR, Estimated: >60 mL/min (ref >60)
Glucose, Bld: 84 mg/dL (ref 70–99)
Potassium: 3.3 mmol/L — ABNORMAL LOW (ref 3.5–5.1)
Sodium: 139 mmol/L (ref 135–145)
Total Bilirubin: 0.9 mg/dL (ref 0.3–1.2)
Total Protein: 6.3 g/dL — ABNORMAL LOW (ref 6.5–8.1)

## 2022-03-13 LAB — CARBAMAZEPINE LEVEL, TOTAL: Carbamazepine Lvl: <2 ug/mL — ABNORMAL LOW (ref 4.0–12.0)

## 2022-03-13 NOTE — ED Triage Notes (Signed)
Pt [place] for a reported seizure. Per patient - unsure how long it lasted as it was unwitnessed. Pt is compliant with medications. Endorses a few episodes of diarrhea. Denies LOC, N/V, CP or SOB.

## 2022-03-13 NOTE — ED Provider Triage Note (Signed)
Emergency Medicine Provider Triage Evaluation Note  Maureen George , a 27 y.o. female  was evaluated in triage.  Pt complains of seizure. She isn't sure what happened or how long seizure lasted. Takes Tegretol and states she is compliant. Denies loss of bowel or bladder control. No headache. Did not bite her tongue.  Physical Exam  LMP 03/05/2022 (Exact Date)  Gen:   Awake, no distress   Resp:  Normal effort  MSK:   Moves extremities without difficulty  Other:    Medical Decision Making  Medically screening exam initiated at 8:05 PM.  Appropriate orders placed.  Maureen George was informed that the remainder of the evaluation will be completed by another provider, this initial triage assessment does not replace that evaluation, and the importance of remaining in the ED until their evaluation is complete.    Victorino Dike, FNP 03/13/22 2014

## 2022-03-13 NOTE — ED Triage Notes (Signed)
First nurse note: pt to ER [place] ems [place] gas station for reported seizure.  No witnesses of seizure per ems.  Pt does have hx of same and takes tegretol and has not missed any doses recently.  Pt A&O x4 with ems, with no post-ictal period.    EMS VS: 63 HR, 98% RA, 139/62, 24 resp.  CBG 140.

## 2022-03-14 ENCOUNTER — Emergency Department
Admission: EM | Admit: 2022-03-14 | Discharge: 2022-03-14 | Disposition: A | Discharge status: Home / Self Care | Payer: Medicaid Other | Attending: Emergency Medicine | Admitting: Emergency Medicine

## 2022-03-14 DIAGNOSIS — R569 Unspecified convulsions: Secondary | ICD-10-CM

## 2022-03-14 DIAGNOSIS — G40909 Epilepsy, unspecified, not intractable, without status epilepticus: Secondary | ICD-10-CM

## 2022-03-14 NOTE — ED Provider Notes (Signed)
[facility] Provider Note    Event Date/Time   First MD Initiated Contact with Patient 03/14/22 0121     (approximate)   History   Seizures   HPI  Maureen George is a 27 y.o. female who presents to the ED for evaluation of Seizures   I reviewed neurology clinic visit from 12/12.  Evaluated for seizure-like activity.  Started on carbamazepine and outpatient EEG was scheduled.  Patient presents with her husband after an episode of seizure-like activity.  She reports this is typical compared to other seizures that she has had in the past.  She reports she has been compliant with her carbamazepine.  Reports no preceding illnesses, trauma or injuries.  Reports that she was out walking with her husband this afternoon and saw the bright lights of passing emergency vehicles and thinks this may have triggered it.  She reports that she feels fine now and just wanted to get checked out.  Physical Exam   Triage Vital Signs: ED Triage Vitals  Enc Vitals Group     BP 03/13/22 2012 111/72     Pulse Rate 03/13/22 2012 65     Resp 03/13/22 2012 16     Temp 03/13/22 2012 98 F (36.7 C)     Temp Source 03/13/22 2012 Oral     SpO2 03/13/22 2012 100 %     Weight 03/13/22 2007 112 lb (50.8 kg)     Height 03/13/22 2007 5\' 6"  (1.676 m)     Head Circumference --      Peak Flow --      Pain Score 03/13/22 2016 4     Pain Loc --      Pain Edu? --      Excl. in GC? --     Most recent vital signs: Vitals:   03/13/22 2012  BP: 111/72  Pulse: 65  Resp: 16  Temp: 98 F (36.7 C)  SpO2: 100%    General: Awake, no distress.  Ambulatory with normal gait. CV:  Good peripheral perfusion.  Resp:  Normal effort.  Abd:  No distention.  MSK:  No deformity noted.  Neuro:  No focal deficits appreciated. Cranial nerves II through XII intact 5/5 strength and sensation in all 4 extremities Other:     ED Results / Procedures / Treatments   Labs (all labs ordered are listed,  but only abnormal results are displayed) Labs Reviewed  COMPREHENSIVE METABOLIC PANEL - Abnormal; Notable for the following components:      Result Value   Potassium 3.3 (*)    Total Protein 6.3 (*)    All other components within normal limits  CBC WITH DIFFERENTIAL/PLATELET - Abnormal; Notable for the following components:   Hemoglobin 11.9 (*)    All other components within normal limits  URINALYSIS, ROUTINE W REFLEX MICROSCOPIC - Abnormal; Notable for the following components:   Color, Urine YELLOW (*)    APPearance HAZY (*)    Ketones, ur 20 (*)    Protein, ur 30 (*)    Bacteria, UA RARE (*)    All other components within normal limits  CARBAMAZEPINE LEVEL, TOTAL - Abnormal; Notable for the following components:   Carbamazepine Lvl <2.0 (*)    All other components within normal limits  CBG MONITORING, ED  POC URINE PREG, ED    EKG   RADIOLOGY   Official radiology report(s): No results found.  PROCEDURES and INTERVENTIONS:  Procedures  Medications - No data to  display   IMPRESSION / MDM / ASSESSMENT AND PLAN / ED COURSE  I reviewed the triage vital signs and the nursing notes.  Differential diagnosis includes, but is not limited to, medication noncompliance, status epilepticus, sepsis, ICH  {Patient presents with symptoms of an acute illness or injury that is potentially life-threatening.  Patient presents with concerns for seizure.  She looks well without signs of trauma or neurologic deficits.  She reports compliance with her Tegretol, but her carbamazepine level is undetectable and her serum, indicative otherwise.  Workup is largely benign otherwise.  Has some mild hypokalemia.  Urine with some ketones suggestive of dehydration for which she receives oral rehydration.  No infectious features.  No leukocytosis or signs of sepsis.  Normal glucose.  We discussed the importance of adherence to her antiepileptic medication and following up closely with her neurologist.   She has an upcoming EEG next month.  We discussed return precautions.      FINAL CLINICAL IMPRESSION(S) / ED DIAGNOSES   Final diagnoses:  Seizure ([place])  Seizure disorder ([place])  Seizure-like activity ([place])     Rx / DC Orders   ED Discharge Orders     None        Note:  This document was prepared using Dragon voice recognition software and may include unintentional dictation errors.   Vladimir Crofts, MD 03/14/22 [phone]

## 2022-03-14 NOTE — Discharge Instructions (Signed)
Please take your seizure medicine every day as prescribed and follow-up with Dr. Melrose Nakayama  -Per [facility] statutes, patients with seizures are not allowed to drive until  they have been seizure-free for six months. Use caution when using heavy equipment or power tools. Avoid working on ladders or at heights. Take showers instead of baths. Ensure the water temperature is not too high on the home water heater. Do not go swimming alone. When caring for infants or small children, sit down when holding, feeding, or changing them to minimize risk of injury to the child in the event you have a seizure. Also, Maintain good sleep hygiene. Avoid alcohol.

## 2022-04-05 ENCOUNTER — Other Ambulatory Visit: Payer: Self-pay

## 2022-04-05 ENCOUNTER — Emergency Department: Payer: Medicaid Other

## 2022-04-05 ENCOUNTER — Other Ambulatory Visit: Payer: Medicaid Other

## 2022-04-05 ENCOUNTER — Encounter: Payer: Self-pay | Admitting: Emergency Medicine

## 2022-04-05 DIAGNOSIS — R079 Chest pain, unspecified: Secondary | ICD-10-CM | POA: Diagnosis present

## 2022-04-05 DIAGNOSIS — R0789 Other chest pain: Secondary | ICD-10-CM | POA: Insufficient documentation

## 2022-04-05 DIAGNOSIS — R9389 Abnormal findings on diagnostic imaging of other specified body structures: Secondary | ICD-10-CM | POA: Insufficient documentation

## 2022-04-05 LAB — BASIC METABOLIC PANEL
Anion gap: 6 (ref 5–15)
BUN: 14 mg/dL (ref 6–20)
CO2: 25 mmol/L (ref 22–32)
Calcium: 10 mg/dL (ref 8.9–10.3)
Chloride: 110 mmol/L (ref 98–111)
Creatinine, Ser: 0.64 mg/dL (ref 0.44–1.00)
GFR, Estimated: >60 mL/min (ref >60)
Glucose, Bld: 93 mg/dL (ref 70–99)
Potassium: 3.7 mmol/L (ref 3.5–5.1)
Sodium: 141 mmol/L (ref 135–145)

## 2022-04-05 LAB — CBC
HCT: 38 % (ref 36.0–46.0)
Hemoglobin: 12.3 g/dL (ref 12.0–15.0)
MCH: 29.1 pg (ref 26.0–34.0)
MCHC: 32.4 g/dL (ref 30.0–36.0)
MCV: 89.8 fL (ref 80.0–100.0)
Platelets: 210 10*3/uL (ref 150–400)
RBC: 4.23 MIL/uL (ref 3.87–5.11)
RDW: 14.3 % (ref 11.5–15.5)
WBC: 5.5 10*3/uL (ref 4.0–10.5)
nRBC: 0 % (ref 0.0–0.2)

## 2022-04-05 LAB — TROPONIN I (HIGH SENSITIVITY)
Troponin I (High Sensitivity): <2 ng/L (ref <18)
Troponin I (High Sensitivity): <2 ng/L (ref <18)

## 2022-04-05 LAB — POC URINE PREG, ED: Preg Test, Ur: NEGATIVE

## 2022-04-05 NOTE — ED Triage Notes (Addendum)
Pt arrived via [facility] from [place], pt reports chest pain started about 1 hour ago while she was drawing in her drawing book.  Pt reports last February she had chest pain and was in first degree heart block. Pt denies any shortness of breath. Pt reports the pain is in the center of the chest.  Pt also reports she has been constipated for about 1 week. Pt states she has been feeling dizzy also and states she hit the R side of her head on a wall at [employer] today.  Denies any LOC, had low BP when she was checked at [facility] today.   Pt reports hx of seizures and has been compliant with her medications.   Pt received 324mg  ASA with EMS prior to arrival

## 2022-04-06 ENCOUNTER — Emergency Department
Admission: EM | Admit: 2022-04-06 | Discharge: 2022-04-06 | Disposition: A | Discharge status: Home / Self Care | Payer: Medicaid Other | Attending: Emergency Medicine | Admitting: Emergency Medicine

## 2022-04-06 DIAGNOSIS — R0789 Other chest pain: Secondary | ICD-10-CM

## 2022-04-06 DIAGNOSIS — R9389 Abnormal findings on diagnostic imaging of other specified body structures: Secondary | ICD-10-CM

## 2022-04-06 NOTE — ED Provider Notes (Signed)
[facility] Provider Note    Event Date/Time   First MD Initiated Contact with Patient 04/06/22 0016     (approximate)   History   Chest Pain   HPI  Maureen George is a 27 y.o. female who presents to the ED for evaluation of Chest Pain   I reviewed neurology clinic visit from 12/12.  Evaluated for seizure-like activity, empirically started on carbamazepine and outpatient EEG was scheduled. Otherwise history of anxiety, ADHD.  Patient presents to the ED for elevation of an episode of chest pain that is since resolved.  She reports that she had chest pain, but "once I realized it was my acid reflux, I realized that was fine."  She blames the fact that she works at an [employer] and frequently consumes red sauce and causes her heartburn.  She denies any cough, fever, shortness of breath or exertional dyspnea.  No recent antibiotics.  She reports it has been nearly a week since her last bowel movement, and this is normal for her to go many days in between bowel movements.  Denies any abdominal pain, emesis or urinary symptoms.  She reports that she feels fine and is requesting discharge immediately.   Physical Exam   Triage Vital Signs: ED Triage Vitals  Enc Vitals Group     BP 04/05/22 1939 95/72     Pulse Rate 04/05/22 1939 67     Resp 04/05/22 1939 16     Temp 04/05/22 1939 98 F (36.7 C)     Temp Source 04/05/22 1939 Oral     SpO2 04/05/22 1939 100 %     Weight 04/05/22 1939 110 lb (49.9 kg)     Height 04/05/22 1939 5\' 6"  (1.676 m)     Head Circumference --      Peak Flow --      Pain Score 04/05/22 1938 6     Pain Loc --      Pain Edu? --      Excl. in [place]? --     Most recent vital signs: Vitals:   04/05/22 1939 04/05/22 2153  BP: 95/72 109/65  Pulse: 67 60  Resp: 16 20  Temp: 98 F (36.7 C)   SpO2: 100% 98%    General: Awake, no distress.  CV:  Good peripheral perfusion.  Resp:  Normal effort.  CTAB, no apparent crackles  to the left base that would correspond with her CXR abnormalities Abd:  No distention.  Soft and benign throughout MSK:  No deformity noted.  Neuro:  No focal deficits appreciated. Other:     ED Results / Procedures / Treatments   Labs (all labs ordered are listed, but only abnormal results are displayed) Labs Reviewed  BASIC METABOLIC PANEL  CBC  POC URINE PREG, ED  TROPONIN I (HIGH SENSITIVITY)  TROPONIN I (HIGH SENSITIVITY)    EKG Sinus rhythm with a rate of 68 bpm.  Tremulous baseline clouds fine detail, but no clear signs of acute ischemia.  Normal axis and intervals.  RADIOLOGY 2 view CXR interpreted by me with left basilar infiltrate and gaseous distention of the stomach or splenic flexure of the colon.  Official radiology report(s): DG Chest 2 View  Result Date: 04/05/2022 CLINICAL DATA:  Chest pain EXAM: CHEST - 2 VIEW COMPARISON:  Chest x-ray March 05, 2022 FINDINGS: Possible developing subtle infiltrate in left base based on the frontal view. The heart, hila, and mediastinum are normal. No pneumothorax. No nodules or  masses. No other acute abnormalities in the chest. There is an air-filled structure under the left hemidiaphragm which could represent dilatation of the stomach for dilatation of the splenic flexure of the colon. IMPRESSION: 1. Possible developing subtle infiltrate in the left base. Recommend short-term follow-up imaging to ensure resolution. 2. There is an air-filled structure under the left hemidiaphragm which could represent dilatation of the stomach or dilatation of the splenic flexure of the colon. Recommend a KUB of the abdomen for better characterization. Electronically Signed   By: Dorise Bullion III M.D.   On: 04/05/2022 20:07    PROCEDURES and INTERVENTIONS:  .1-3 Lead EKG Interpretation  Performed by: Vladimir Crofts, MD Authorized by: Vladimir Crofts, MD     Interpretation: normal     ECG rate:  62   ECG rate assessment: normal     Rhythm:  sinus rhythm     Ectopy: none     Conduction: normal     Medications - No data to display   IMPRESSION / MDM / [place] / ED COURSE  I reviewed the triage vital signs and the nursing notes.  Differential diagnosis includes, but is not limited to, ACS, PTX, PNA, muscle strain/spasm, PE, dissection, SBO, chronic constipation.  {Patient presents with symptoms of an acute illness or injury that is potentially life-threatening.  27 year old female presents to the ED with a resolved episode of atypical chest pain suitable for outpatient management patient looks stomach well and remains asymptomatic here in the ED.  She is a fairly benign workup with a nonischemic EKG and 2 negative troponins.  Normal CBC and metabolic panel.  Her CXR questions a left basilar pneumonia, though she has no symptoms of this.  After discussing risks and benefits, patient would prefer not to start antibiotics.  She declines the radiology recommended KUB considering her dilated loops of bowel to the left upper quadrant.  She reports chronic constipation and she has no abdominal symptoms or tenderness.  Doubt SBO.  We discussed close return precautions.  MiraLAX for constipation.  I considered observation admission for this patient.  Clinical Course as of 04/06/22 0059  Thu Apr 06, 2022  1610 Patient declining x-ray of the abdomen or antibiotics.  We discussed risks of undiagnosed pneumonia.  She acknowledges this and agrees that she will come back if things get worse. [DS]    Clinical Course User Index [DS] Vladimir Crofts, MD     FINAL CLINICAL IMPRESSION(S) / ED DIAGNOSES   Final diagnoses:  Other chest pain  Chest x-ray abnormality     Rx / DC Orders   ED Discharge Orders     None        Note:  This document was prepared using Dragon voice recognition software and may include unintentional dictation errors.   Vladimir Crofts, MD 04/06/22 [phone]

## 2022-04-06 NOTE — Discharge Instructions (Signed)
If you develop a cough, fever or difficulty breathing then please return to the ED for treatment and evaluation of possible pneumonia.  Use MiraLAX for your constipation  Return to the ED with worsening symptoms

## 2022-04-07 ENCOUNTER — Other Ambulatory Visit: Payer: Self-pay

## 2022-04-07 ENCOUNTER — Emergency Department: Payer: Medicaid Other

## 2022-04-07 ENCOUNTER — Emergency Department
Admission: EM | Admit: 2022-04-07 | Discharge: 2022-04-07 | Disposition: A | Discharge status: Home / Self Care | Payer: Medicaid Other | Attending: Emergency Medicine | Admitting: Emergency Medicine

## 2022-04-07 ENCOUNTER — Encounter: Payer: Self-pay | Admitting: Emergency Medicine

## 2022-04-07 DIAGNOSIS — J111 Influenza due to unidentified influenza virus with other respiratory manifestations: Secondary | ICD-10-CM

## 2022-04-07 DIAGNOSIS — Z20822 Contact with and (suspected) exposure to covid-19: Secondary | ICD-10-CM | POA: Diagnosis not present

## 2022-04-07 DIAGNOSIS — J181 Lobar pneumonia, unspecified organism: Secondary | ICD-10-CM | POA: Diagnosis not present

## 2022-04-07 DIAGNOSIS — J1008 Influenza due to other identified influenza virus with other specified pneumonia: Secondary | ICD-10-CM | POA: Insufficient documentation

## 2022-04-07 DIAGNOSIS — K5900 Constipation, unspecified: Secondary | ICD-10-CM

## 2022-04-07 DIAGNOSIS — J189 Pneumonia, unspecified organism: Secondary | ICD-10-CM

## 2022-04-07 DIAGNOSIS — R531 Weakness: Secondary | ICD-10-CM | POA: Diagnosis present

## 2022-04-07 LAB — CBC WITH DIFFERENTIAL/PLATELET
Abs Immature Granulocytes: 0.01 10*3/uL (ref 0.00–0.07)
Basophils Absolute: 0 10*3/uL (ref 0.0–0.1)
Basophils Relative: 0 %
Eosinophils Absolute: 0.1 10*3/uL (ref 0.0–0.5)
Eosinophils Relative: 2 %
HCT: 35.9 % — ABNORMAL LOW (ref 36.0–46.0)
Hemoglobin: 11.6 g/dL — ABNORMAL LOW (ref 12.0–15.0)
Immature Granulocytes: 0 %
Lymphocytes Relative: 11 %
Lymphs Abs: 0.4 10*3/uL — ABNORMAL LOW (ref 0.7–4.0)
MCH: 29.2 pg (ref 26.0–34.0)
MCHC: 32.3 g/dL (ref 30.0–36.0)
MCV: 90.4 fL (ref 80.0–100.0)
Monocytes Absolute: 0.2 10*3/uL (ref 0.1–1.0)
Monocytes Relative: 5 %
Neutro Abs: 3.2 10*3/uL (ref 1.7–7.7)
Neutrophils Relative %: 82 %
Platelets: 126 10*3/uL — ABNORMAL LOW (ref 150–400)
RBC: 3.97 MIL/uL (ref 3.87–5.11)
RDW: 14 % (ref 11.5–15.5)
WBC: 4 10*3/uL (ref 4.0–10.5)
nRBC: 0 % (ref 0.0–0.2)

## 2022-04-07 LAB — URINALYSIS, W/ REFLEX TO CULTURE (INFECTION SUSPECTED)
Bilirubin Urine: NEGATIVE
Glucose, UA: NEGATIVE mg/dL
Ketones, ur: 20 mg/dL — AB
Nitrite: NEGATIVE
Protein, ur: NEGATIVE mg/dL
Specific Gravity, Urine: 1.013 (ref 1.005–1.030)
pH: 5 (ref 5.0–8.0)

## 2022-04-07 LAB — COMPREHENSIVE METABOLIC PANEL
ALT: 11 U/L (ref 0–44)
AST: 16 U/L (ref 15–41)
Albumin: 3.5 g/dL (ref 3.5–5.0)
Alkaline Phosphatase: 50 U/L (ref 38–126)
Anion gap: 7 (ref 5–15)
BUN: 14 mg/dL (ref 6–20)
CO2: 19 mmol/L — ABNORMAL LOW (ref 22–32)
Calcium: 8.9 mg/dL (ref 8.9–10.3)
Chloride: 113 mmol/L — ABNORMAL HIGH (ref 98–111)
Creatinine, Ser: 0.58 mg/dL (ref 0.44–1.00)
GFR, Estimated: >60 mL/min (ref >60)
Glucose, Bld: 74 mg/dL (ref 70–99)
Potassium: 3.5 mmol/L (ref 3.5–5.1)
Sodium: 139 mmol/L (ref 135–145)
Total Bilirubin: 0.9 mg/dL (ref 0.3–1.2)
Total Protein: 5.8 g/dL — ABNORMAL LOW (ref 6.5–8.1)

## 2022-04-07 LAB — RESP PANEL BY RT-PCR (RSV, FLU A&B, COVID)  RVPGX2
Influenza A by PCR: NEGATIVE
Influenza B by PCR: POSITIVE — AB
Resp Syncytial Virus by PCR: NEGATIVE
SARS Coronavirus 2 by RT PCR: NEGATIVE

## 2022-04-07 LAB — POC URINE PREG, ED: Preg Test, Ur: NEGATIVE

## 2022-04-07 LAB — LIPASE, BLOOD: Lipase: 32 U/L (ref 11–51)

## 2022-04-07 LAB — TROPONIN I (HIGH SENSITIVITY): Troponin I (High Sensitivity): <2 ng/L (ref <18)

## 2022-04-07 MED ORDER — DOXYCYCLINE MONOHYDRATE 100 MG PO TABS: 100.0000 mg | ORAL_TABLET | Freq: Two times a day (BID) | ORAL | 0 refills | Status: DC

## 2022-04-07 MED ORDER — OSELTAMIVIR PHOSPHATE 75 MG PO CAPS: 75.0000 mg | ORAL_CAPSULE | Freq: Two times a day (BID) | ORAL | 0 refills | Status: DC

## 2022-04-07 MED ORDER — ONDANSETRON HCL 4 MG/2ML IJ SOLN
4.0000 mg | Freq: Once | INTRAMUSCULAR | Status: AC
  Administered 2022-04-07: 4 mg via INTRAVENOUS
  Filled 2022-04-07: qty 2

## 2022-04-07 MED ORDER — LACTULOSE 10 GM/15ML PO SOLN
10.0000 g | Freq: Once | ORAL | Status: AC
  Administered 2022-04-07: 10 g via ORAL
  Filled 2022-04-07: qty 30

## 2022-04-07 MED ORDER — ONDANSETRON 4 MG PO TBDP: 4.0000 mg | ORAL_TABLET | Freq: Three times a day (TID) | ORAL | 0 refills | Status: AC | PRN

## 2022-04-07 MED ORDER — ACETAMINOPHEN 500 MG PO TABS
1000.0000 mg | ORAL_TABLET | Freq: Once | ORAL | Status: AC
  Administered 2022-04-07: 1000 mg via ORAL
  Filled 2022-04-07: qty 2

## 2022-04-07 MED ORDER — OSELTAMIVIR PHOSPHATE 75 MG PO CAPS: 75.0000 mg | ORAL_CAPSULE | Freq: Two times a day (BID) | ORAL | 0 refills | Status: AC

## 2022-04-07 MED ORDER — DOXYCYCLINE MONOHYDRATE 100 MG PO TABS: 100.0000 mg | ORAL_TABLET | Freq: Two times a day (BID) | ORAL | 0 refills | Status: AC

## 2022-04-07 MED ORDER — ONDANSETRON 4 MG PO TBDP: 4.0000 mg | ORAL_TABLET | Freq: Three times a day (TID) | ORAL | 0 refills | Status: DC | PRN

## 2022-04-07 MED ORDER — SODIUM CHLORIDE 0.9 % IV BOLUS
1000.0000 mL | Freq: Once | INTRAVENOUS | Status: AC
  Administered 2022-04-07: 1000 mL via INTRAVENOUS

## 2022-04-07 NOTE — ED Triage Notes (Signed)
Pt here with emesis. Pt states she was here recently and dx with pneumonia and still does not feel any better. Pt states she also needs a work note.

## 2022-04-07 NOTE — ED Notes (Signed)
No vomiting noted since arrival to ED.

## 2022-04-07 NOTE — ED Triage Notes (Signed)
First nurse note: Pt to ED via [facility]. Pt reports still feeling weak and congested. Pt hypotensive with EMS and administered NS bolus. Pt seen on 2/1 for multiple complaints.   EMS VS: CBG 93 99% RA 88 palp

## 2022-04-07 NOTE — ED Notes (Signed)
12 yof with a c/c of abdominal and chest discomfort. The pt advised this is a continuation of complaints from the other day she was here.

## 2022-04-07 NOTE — ED Notes (Signed)
Pt provided with food and water for PO challenge

## 2022-04-07 NOTE — ED Provider Notes (Signed)
[facility] Provider Note    Event Date/Time   First MD Initiated Contact with Patient 04/07/22 1059     (approximate)   History   Emesis   HPI  Maureen George is a 27 y.o. female with history of anxiety, ADHD, recent workup for seizure-like activity who comes in with concerns for generalized weakness.  On review of records patient was seen 2 days ago for some chest discomfort.  Patient had questionable pneumonia noted on her x-ray but had declined antibiotics at that time due to not really having any symptoms for it.  She declined an x-ray of her abdomen given no abdominal pain.  Patient reports that since then she has had worsening abdominal pain with nausea, vomiting.  She is also reporting having some more chest discomfort.  She states that she has been having more congestion and hoarse voice.  She denies any risk factors for pulmonary embolism.  Denies any swelling in her legs.  She does report being more constipated than normal even with MiraLAX.    Physical Exam   Triage Vital Signs: ED Triage Vitals  Enc Vitals Group     BP 04/07/22 1004 103/66     Pulse Rate 04/07/22 1004 62     Resp 04/07/22 1004 16     Temp 04/07/22 1004 98.2 F (36.8 C)     Temp Source 04/07/22 1004 Oral     SpO2 04/07/22 1004 100 %     Weight 04/07/22 1006 110 lb 0.2 oz (49.9 kg)     Height 04/07/22 1006 5\' 6"  (1.676 m)     Head Circumference --      Peak Flow --      Pain Score 04/07/22 1006 8     Pain Loc --      Pain Edu? --      Excl. in [place]? --     Most recent vital signs: Vitals:   04/07/22 1004  BP: 103/66  Pulse: 62  Resp: 16  Temp: 98.2 F (36.8 C)  SpO2: 100%     General: Awake, no distress.  CV:  Good peripheral perfusion.  Resp:  Normal effort.  Abd:  No distention.  Slightly tender throughout her abdomen Other:  No swelling in legs.  No calf tenderness   ED Results / Procedures / Treatments   Labs (all labs ordered are listed, but only  abnormal results are displayed) Labs Reviewed  CBC WITH DIFFERENTIAL/PLATELET - Abnormal; Notable for the following components:      Result Value   Hemoglobin 11.6 (*)    HCT 35.9 (*)    Platelets 126 (*)    Lymphs Abs 0.4 (*)    All other components within normal limits  COMPREHENSIVE METABOLIC PANEL - Abnormal; Notable for the following components:   Chloride 113 (*)    CO2 19 (*)    Total Protein 5.8 (*)    All other components within normal limits  RESP PANEL BY RT-PCR (RSV, FLU A&B, COVID)  RVPGX2  LIPASE, BLOOD  URINALYSIS, W/ REFLEX TO CULTURE (INFECTION SUSPECTED)  POC URINE PREG, ED  TROPONIN I (HIGH SENSITIVITY)     EKG  My interpretation of EKG:  Pt declined   RADIOLOGY I have reviewed the xray personally and interpreted and patient has stool noted   PROCEDURES:  Critical Care performed: No  Procedures   MEDICATIONS ORDERED IN ED: Medications  ondansetron (ZOFRAN) injection 4 mg (4 mg Intravenous Given 04/07/22 1148)  sodium chloride 0.9 % bolus 1,000 mL (1,000 mLs Intravenous New Bag/Given 04/07/22 1148)  acetaminophen (TYLENOL) tablet 1,000 mg (1,000 mg Oral Given 04/07/22 1149)     IMPRESSION / MDM / ASSESSMENT AND PLAN / ED COURSE  I reviewed the triage vital signs and the nursing notes.   Patient's presentation is most consistent with acute presentation with potential threat to life or bodily function.   Patient comes in with multiple symptoms concerning for possible viral illness, recently diagnosed with possible pneumonia on x-ray but declined antibiotics at the time.  Will test for COVID, flu.  Will get an x-ray of her abdomen given and it was recommended on prior chest x-ray but initially she had declined but given worsening symptoms will now evaluate further.  Will get some repeat labs to evaluate for DKA dehydration, UTI.  Lipase normal.  CBC shows normal white count.  Platelets slightly low.  Patient has some signs of slight dehydration with low  bicarb patient is getting fluids.  Her lipase is normal.   Patient feeling much better after medications x-ray with large amount of stool noted in it.  Repeat abdominal exam is soft nontender do not feel like CT is necessary low suspicion for acute abdominal process.  She does report a lot of constipation.  Her pregnancy test was negative cardiac marker was negative.  We discussed EKG given it had not been done yet but patient declined stating that she just had one 2 days ago no new chest pain.  Her urine had some bacteria in it.  Discussed with patient she denies any urinary symptoms or increased urinary frequency.  Will hold off on antibiotics given asymptomatic.  Patient is flu positive.  Given patient did not have any testing prior she states that the symptoms started 2 days ago we discussed Tamiflu pros and cons and she would like to proceed.  Will also cover her for possible pneumonia given that was seen on her x-ray 2 days ago.  Patient expressed understanding.  She is tolerating p.o. and feels comfortable with discharge home.  Will add on urine culture given the many bacteria so that if patient does develop symptoms they can call patient back and treat appropriately.  Patient requesting something for her constipation will give a dose of lactulose and patient instructed to use MiraLAX at home   FINAL CLINICAL IMPRESSION(S) / ED DIAGNOSES   Final diagnoses:  Flu  Pneumonia of left lung due to infectious organism, unspecified part of lung     Rx / DC Orders   ED Discharge Orders          Ordered    oseltamivir (TAMIFLU) 75 MG capsule  2 times daily        04/07/22 1506    doxycycline (ADOXA) 100 MG tablet  2 times daily        04/07/22 1506    ondansetron (ZOFRAN-ODT) 4 MG disintegrating tablet  Every 8 hours PRN        04/07/22 1506             Note:  This document was prepared using Dragon voice recognition software and may include unintentional dictation errors.    Maureen [place], MD 04/07/22 [phone]

## 2022-04-07 NOTE — Discharge Instructions (Signed)
Take the Tamiflu for the flu you are flu positive.  Take the antibiotics in case there could be a concurrent pneumonia.  You need to follow-up for outpatient chest x-ray in 4 to 6 weeks to ensure resolution.  Take Zofran to help with any nausea.  Return to the ER if develop worsening symptoms or any other concerns.  Stay well-hydrated.

## 2022-04-11 ENCOUNTER — Encounter: Payer: Self-pay | Admitting: Emergency Medicine

## 2022-04-11 ENCOUNTER — Other Ambulatory Visit: Payer: Self-pay

## 2022-04-11 ENCOUNTER — Emergency Department
Admission: EM | Admit: 2022-04-11 | Discharge: 2022-04-11 | Disposition: A | Discharge status: Home / Self Care | Payer: Medicaid Other | Attending: Student in an Organized Health Care Education/Training Program | Admitting: Student in an Organized Health Care Education/Training Program

## 2022-04-11 ENCOUNTER — Emergency Department: Payer: Medicaid Other

## 2022-04-11 DIAGNOSIS — R072 Precordial pain: Secondary | ICD-10-CM | POA: Diagnosis not present

## 2022-04-11 DIAGNOSIS — J45909 Unspecified asthma, uncomplicated: Secondary | ICD-10-CM | POA: Diagnosis not present

## 2022-04-11 DIAGNOSIS — R079 Chest pain, unspecified: Secondary | ICD-10-CM

## 2022-04-11 LAB — CBC
HCT: 35.1 % — ABNORMAL LOW (ref 36.0–46.0)
Hemoglobin: 11.5 g/dL — ABNORMAL LOW (ref 12.0–15.0)
MCH: 29.1 pg (ref 26.0–34.0)
MCHC: 32.8 g/dL (ref 30.0–36.0)
MCV: 88.9 fL (ref 80.0–100.0)
Platelets: 120 10*3/uL — ABNORMAL LOW (ref 150–400)
RBC: 3.95 MIL/uL (ref 3.87–5.11)
RDW: 14.1 % (ref 11.5–15.5)
WBC: 2.6 10*3/uL — ABNORMAL LOW (ref 4.0–10.5)
nRBC: 0 % (ref 0.0–0.2)

## 2022-04-11 LAB — BASIC METABOLIC PANEL
Anion gap: 5 (ref 5–15)
BUN: 11 mg/dL (ref 6–20)
CO2: 27 mmol/L (ref 22–32)
Calcium: 9.1 mg/dL (ref 8.9–10.3)
Chloride: 108 mmol/L (ref 98–111)
Creatinine, Ser: 0.64 mg/dL (ref 0.44–1.00)
GFR, Estimated: >60 mL/min (ref >60)
Glucose, Bld: 110 mg/dL — ABNORMAL HIGH (ref 70–99)
Potassium: 2.9 mmol/L — ABNORMAL LOW (ref 3.5–5.1)
Sodium: 140 mmol/L (ref 135–145)

## 2022-04-11 LAB — TROPONIN I (HIGH SENSITIVITY): Troponin I (High Sensitivity): <2 ng/L (ref <18)

## 2022-04-11 MED ORDER — SODIUM CHLORIDE 0.9 % IV BOLUS: 1000.0000 mL | Freq: Once | INTRAVENOUS | Status: DC

## 2022-04-11 NOTE — ED Triage Notes (Signed)
Pt here via [facility] with cp. Pt states the pain started about 30 mins ago. Pt states the pain is centered and radiates to her left. Pt describes the pain as pressure.

## 2022-04-11 NOTE — ED Provider Notes (Signed)
[facility] Provider Note    Event Date/Time   First MD Initiated Contact with Patient 04/11/22 1511     (approximate)   History   Chest Pain   HPI  Maureen George is a 27 y.o. female history of anxiety PTSD asthma as a child presents to the ER after recent diagnosis of pneumonia and flu status post Tamiflu last week with an episode of midsternal nonradiating chest pain lasting a few minutes around lunch today.  She is currently pain-free.  Denies any shortness of breath.  No discomfort with deep inspiration.  Denies any fevers or chills.  No nausea or vomiting.  Denies any abdominal discomfort no dysuria.  Noted low blood pressure in triage patient states that her blood pressure always runs low.     Physical Exam   Triage Vital Signs: ED Triage Vitals  Enc Vitals Group     BP 04/11/22 1304 (!) 89/58     Pulse Rate 04/11/22 1304 85     Resp 04/11/22 1304 18     Temp 04/11/22 1304 98.3 F (36.8 C)     Temp Source 04/11/22 1304 Oral     SpO2 04/11/22 1304 100 %     Weight 04/11/22 1306 110 lb 0.2 oz (49.9 kg)     Height 04/11/22 1306 5\' 6"  (1.676 m)     Head Circumference --      Peak Flow --      Pain Score 04/11/22 1305 8     Pain Loc --      Pain Edu? --      Excl. in [place]? --     Most recent vital signs: Vitals:   04/11/22 1304 04/11/22 1526  BP: (!) 89/58 109/69  Pulse: 85 80  Resp: 18 18  Temp: 98.3 F (36.8 C)   SpO2: 100% 99%     Constitutional: Alert  Eyes: Conjunctivae are normal.  Head: Atraumatic. Nose: No congestion/rhinnorhea. Mouth/Throat: Mucous membranes are moist.   Neck: Painless ROM.  Cardiovascular:   Good peripheral circulation. Respiratory: Normal respiratory effort.  No retractions.  Gastrointestinal: Soft and nontender.  Musculoskeletal:  no deformity Neurologic:  MAE spontaneously. No gross focal neurologic deficits are appreciated.  Skin:  Skin is warm, dry and intact. No rash noted. Psychiatric: Mood and  affect are normal. Speech and behavior are normal.    ED Results / Procedures / Treatments   Labs (all labs ordered are listed, but only abnormal results are displayed) Labs Reviewed  BASIC METABOLIC PANEL - Abnormal; Notable for the following components:      Result Value   Potassium 2.9 (*)    Glucose, Bld 110 (*)    All other components within normal limits  CBC - Abnormal; Notable for the following components:   WBC 2.6 (*)    Hemoglobin 11.5 (*)    HCT 35.1 (*)    Platelets 120 (*)    All other components within normal limits  POC URINE PREG, ED  TROPONIN I (HIGH SENSITIVITY)  TROPONIN I (HIGH SENSITIVITY)     EKG  ED ECG REPORT I, Merlyn Lot, the attending physician, personally viewed and interpreted this ECG.   Date: 04/11/2022  EKG Time: 13:06  Rate: 80  Rhythm: sinus  Axis: normal  Intervals: normal  ST&T Change: nonspecific t wave abn unchanged compared to prior    RADIOLOGY Please see ED Course for my review and interpretation.  I personally reviewed all radiographic images ordered to  evaluate for the above acute complaints and reviewed radiology reports and findings.  These findings were personally discussed with the patient.  Please see medical record for radiology report.    PROCEDURES:  Critical Care performed:   Procedures   MEDICATIONS ORDERED IN ED: Medications  sodium chloride 0.9 % bolus 1,000 mL (has no administration in time range)     IMPRESSION / MDM / ASSESSMENT AND PLAN / ED COURSE  I reviewed the triage vital signs and the nursing notes.                              Differential diagnosis includes, but is not limited to, ACS, pericarditis, esophagitis, boerhaaves, pe, dissection, pna, bronchitis, costochondritis     Patient presenting to the ER for evaluation of symptoms as described above.  Based on symptoms, risk factors and considered above differential, this presenting complaint could reflect a potentially  life-threatening illness therefore the patient will be placed on continuous pulse oximetry and telemetry for monitoring.  Laboratory evaluation will be sent to evaluate for the above complaints.  Chest x-ray my review and interpretation without evidence of pneumothorax or consolidation.  Her EKG is nonischemic.  Her troponin is negative.  She is pain-free.  She is low risk by Wells criteria she is PERC negative.  This not consistent with dissection.  Abdominal exam soft and benign.  Repeat blood pressure normal.  Suspect incorrect size and cuff done in triage.  Patient states that she feels well and is comfortable with discharge home.        FINAL CLINICAL IMPRESSION(S) / ED DIAGNOSES   Final diagnoses:  Nonspecific chest pain     Rx / DC Orders   ED Discharge Orders     None        Note:  This document was prepared using Dragon voice recognition software and may include unintentional dictation errors.    Merlyn Lot, MD 04/11/22 [phone]

## 2022-04-11 NOTE — ED Triage Notes (Signed)
First Nurse Note;  Pt via EMS from home. Pt states she was dx with flu/PNA a week ago. States that she has completed her prescription medication. Pt c/o SOB and CP. Pt is A&OX4 and NAD  94/63 BP, 95 on RA, ETCO2 39, 76 HR.

## 2022-04-20 NOTE — Congregational Nurse Program (Signed)
  Dept: [phone]   Congregational Nurse Program Note  Date of Encounter: 04/20/2022 Client to [facility] with request for assistance in getting a refill on her "anxiety medication". She does have a PCP, [facility] in [place] and she gets her medication from that pharmacy.  RN assisted client in contacting the [facility]. She was able to have her hydroxyzine refills. She reports that her father in law would be able to transport her to the clinic to pick up her medication. No other needs at this time. Of note client does sign in at [facility] as Maureen George. Birthday and Education officer, museum. Past Medical History: Past Medical History:  Diagnosis Date   ADHD (attention deficit hyperactivity disorder)    Anxiety    Seizures ([place])     Encounter Details:  CNP Questionnaire - 04/20/22 0915       Questionnaire   Ask client: Do you give verbal consent for me to treat you today? Yes    Student Assistance N/A    Location Patient [place]    Visit Setting with Client Organization    Patient Status Unhoused    Insurance Medicaid    Insurance/Financial Assistance Referral N/A    Medication Provided Medication Assistance   assisted client with contacting PCP pharmacy for refill of hydroxizine   Medical Provider Yes   [facility] in [place] Appointment Made N/A    Recently w/o PCP, now 1st time PCP visit completed due to CNs referral or appointment made N/A    Food N/A    Transportation N/A   has family with a car   Housing/Utilities No permanent housing    Chiropractor N/A    Interventions [place];Advocate/Support    Abnormal to Normal Screening Since Last CN Visit N/A    Screenings CN Performed N/A    Sent Client to Lab for: N/A    Did client attend any of the following based off CNs referral or  appointments made? N/A    ED Visit Averted N/A    Life-Saving Intervention Made N/A

## 2022-05-21 ENCOUNTER — Emergency Department
Admission: EM | Admit: 2022-05-21 | Discharge: 2022-05-22 | Disposition: A | Discharge status: Home / Self Care | Payer: Medicaid Other | Attending: Hospitalist | Admitting: Hospitalist

## 2022-05-21 ENCOUNTER — Other Ambulatory Visit: Payer: Self-pay

## 2022-05-21 DIAGNOSIS — R569 Unspecified convulsions: Secondary | ICD-10-CM | POA: Diagnosis present

## 2022-05-21 DIAGNOSIS — F411 Generalized anxiety disorder: Secondary | ICD-10-CM | POA: Diagnosis present

## 2022-05-21 DIAGNOSIS — G40909 Epilepsy, unspecified, not intractable, without status epilepticus: Secondary | ICD-10-CM

## 2022-05-21 DIAGNOSIS — Z91040 Latex allergy status: Secondary | ICD-10-CM | POA: Diagnosis not present

## 2022-05-21 DIAGNOSIS — J45909 Unspecified asthma, uncomplicated: Secondary | ICD-10-CM | POA: Diagnosis present

## 2022-05-21 LAB — CARBAMAZEPINE LEVEL, TOTAL: Carbamazepine Lvl: <2 ug/mL — ABNORMAL LOW (ref 4.0–12.0)

## 2022-05-21 LAB — CBC
HCT: 34.9 % — ABNORMAL LOW (ref 36.0–46.0)
Hemoglobin: 11.3 g/dL — ABNORMAL LOW (ref 12.0–15.0)
MCH: 29.4 pg (ref 26.0–34.0)
MCHC: 32.4 g/dL (ref 30.0–36.0)
MCV: 90.6 fL (ref 80.0–100.0)
Platelets: 259 10*3/uL (ref 150–400)
RBC: 3.85 MIL/uL — ABNORMAL LOW (ref 3.87–5.11)
RDW: 14.5 % (ref 11.5–15.5)
WBC: 5.3 10*3/uL (ref 4.0–10.5)
nRBC: 0 % (ref 0.0–0.2)

## 2022-05-21 LAB — BASIC METABOLIC PANEL
Anion gap: 3 — ABNORMAL LOW (ref 5–15)
BUN: 10 mg/dL (ref 6–20)
CO2: 22 mmol/L (ref 22–32)
Calcium: 9.1 mg/dL (ref 8.9–10.3)
Chloride: 112 mmol/L — ABNORMAL HIGH (ref 98–111)
Creatinine, Ser: 0.7 mg/dL (ref 0.44–1.00)
GFR, Estimated: >60 mL/min (ref >60)
Glucose, Bld: 93 mg/dL (ref 70–99)
Potassium: 3.5 mmol/L (ref 3.5–5.1)
Sodium: 137 mmol/L (ref 135–145)

## 2022-05-21 LAB — POC URINE PREG, ED: Preg Test, Ur: NEGATIVE

## 2022-05-21 MED ORDER — CARBAMAZEPINE 100 MG PO CHEW
100.0000 mg | CHEWABLE_TABLET | Freq: Once | ORAL | Status: AC
  Administered 2022-05-21: 100 mg via ORAL
  Filled 2022-05-21: qty 1

## 2022-05-21 MED ORDER — LEVETIRACETAM IN NACL 1000 MG/100ML IV SOLN
1000.0000 mg | Freq: Once | INTRAVENOUS | Status: AC
  Administered 2022-05-21: 1000 mg via INTRAVENOUS
  Filled 2022-05-21: qty 100

## 2022-05-21 NOTE — ED Triage Notes (Signed)
Pt to ED from home for seizures. Pt had recent seizure medication changes on 04/13/22. Pt has been having "sezures all day " as stated by EMS crew. Pt is CAOx4 and in no acute distress in triage. Pt answered phone call in triage. IV 20LAC. 105/65 82HR  96%  123 BGL

## 2022-05-21 NOTE — ED Provider Notes (Signed)
[facility] Provider Note    Event Date/Time   First MD Initiated Contact with Patient 05/21/22 2327     (approximate)   History   Seizures   HPI  Maureen George is a 27 y.o. female with history of seizure-like activity followed by Dr. Melrose Nakayama with neurology who presents to the emergency department with multiple generalized tonic-clonic seizures today.  Reports these were witnessed by her roommate.  She states she has been incontinent of stool but no urine incontinence, tongue biting.  She reports she has been out of her Tegretol for at least 2 weeks.  She has tried Lamictal before but had a rash.  Has not tried any other seizure medications.  Denies fevers, cough, chest pain, shortness of breath, vomiting, diarrhea, numbness, tingling, weakness.   History provided by patient.    Past Medical History:  Diagnosis Date   ADHD (attention deficit hyperactivity disorder)    Anxiety    Seizures ([place])     Past Surgical History:  Procedure Laterality Date   WISDOM TOOTH EXTRACTION      MEDICATIONS:  Prior to Admission medications   Medication Sig Start Date End Date Taking? Authorizing Provider  Doxylamine-Pyridoxine (DICLEGIS) 10-10 MG TBEC Two tablets at bedtime on day 1 and 2; if symptoms persist, take 1 tablet in morning and 2 tablets at bedtime on day 3; if symptoms persist, may increase to 1 tablet in morning, 1 tablet mid-afternoon, and 2 tablets at bedtime on day 4 for a maximum of 4 tablets per day. Use the minimum dose necessary to control your symptoms. 03/22/18   Hinda Kehr, MD    Physical Exam   Triage Vital Signs: ED Triage Vitals [05/21/22 1918]  Enc Vitals Group     BP 103/63     Pulse Rate 76     Resp 16     Temp 98.2 F (36.8 C)     Temp Source Oral     SpO2 98 %     Weight 110 lb 3.7 oz (50 kg)     Height 5\' 6"  (1.676 m)     Head Circumference      Peak Flow      Pain Score 0     Pain Loc      Pain Edu?      Excl. in [place]?      Most recent vital signs: Vitals:   05/21/22 1918 05/21/22 2345  BP: 103/63 120/73  Pulse: 76 75  Resp: 16 18  Temp: 98.2 F (36.8 C) 98.4 F (36.9 C)  SpO2: 98% 100%    CONSTITUTIONAL: Alert, responds appropriately to questions. Well-appearing; well-nourished HEAD: Normocephalic, atraumatic EYES: Conjunctivae clear, pupils appear equal, sclera nonicteric ENT: normal nose; moist mucous membranes NECK: Supple, normal ROM CARD: RRR; S1 and S2 appreciated RESP: Normal chest excursion without splinting or tachypnea; breath sounds clear and equal bilaterally; no wheezes, no rhonchi, no rales, no hypoxia or respiratory distress, speaking full sentences ABD/GI: Non-distended; soft, non-tender, no rebound, no guarding, no peritoneal signs BACK: The back appears normal EXT: Normal ROM in all joints; no deformity noted, no edema SKIN: Normal color for age and race; warm; no rash on exposed skin NEURO: Moves all extremities equally, normal speech, no facial asymmetry PSYCH: The patient's mood and manner are appropriate.   ED Results / Procedures / Treatments   LABS: (all labs ordered are listed, but only abnormal results are displayed) Labs Reviewed  BASIC METABOLIC PANEL - Abnormal;  Notable for the following components:      Result Value   Chloride 112 (*)    Anion gap 3 (*)    All other components within normal limits  CARBAMAZEPINE LEVEL, TOTAL - Abnormal; Notable for the following components:   Carbamazepine Lvl <2.0 (*)    All other components within normal limits  CBC - Abnormal; Notable for the following components:   RBC 3.85 (*)    Hemoglobin 11.3 (*)    HCT 34.9 (*)    All other components within normal limits  URINALYSIS, ROUTINE W REFLEX MICROSCOPIC  URINE DRUG SCREEN, QUALITATIVE ([facility] ONLY)  POC URINE PREG, ED     EKG:   RADIOLOGY: My personal review and interpretation of imaging:    I have personally reviewed all radiology reports.   No results  found.   PROCEDURES:  Critical Care performed: No      Procedures    IMPRESSION / MDM / ASSESSMENT AND PLAN / ED COURSE  I reviewed the triage vital signs and the nursing notes.    Patient here with recurrent seizures in the setting of medication noncompliance.  The patient is on the cardiac monitor to evaluate for evidence of arrhythmia and/or significant heart rate changes.   DIFFERENTIAL DIAGNOSIS (includes but not limited to):   Epileptic seizures, nonepileptic seizures, medication noncompliance, UTI, electrolyte derangement   Patient's presentation is most consistent with acute presentation with potential threat to life or bodily function.   PLAN: Labs obtained from triage.  No leukocytosis, stable mild anemia.  No electrolyte derangement.  Pregnancy test negative.  Tegretol level is undetectable.  Will give oral Tegretol here but also loaded with IV Keppra.  Given multiple seizures today witnessed by her roommate, will admit to medicine.  Patient comfortable with this plan.   MEDICATIONS GIVEN IN ED: Medications  levETIRAcetam (KEPPRA) IVPB 1000 mg/100 mL premix (1,000 mg Intravenous New Bag/Given 05/21/22 2350)  carbamazepine (TEGRETOL) chewable tablet 100 mg (100 mg Oral Given 05/21/22 2354)     ED COURSE: Urinalysis, urine drug screen pending.   CONSULTS:  Consulted and discussed patient's case with hospitalist, Dr. Damita Dunnings.  I have recommended admission and consulting physician agrees and will place admission orders.  Patient (and family if present) agree with this plan.   I reviewed all nursing notes, vitals, pertinent previous records.  All labs, EKGs, imaging ordered have been independently reviewed and interpreted by myself.    OUTSIDE RECORDS REVIEWED: Reviewed neurology notes in December 2023 in February 2024.   MRI brain 10/22/2021:  IMPRESSION:  1. Possible focus of cortical thickening in the inferolateral right  frontal lobe, which is  nonspecific but could represent a seizure  focus. No other seizure etiology identified.  2. No acute intracranial process.       FINAL CLINICAL IMPRESSION(S) / ED DIAGNOSES   Final diagnoses:  Seizure-like activity ([place])     Rx / DC Orders   ED Discharge Orders     None        Note:  This document was prepared using Dragon voice recognition software and may include unintentional dictation errors.   Adriell Polansky, Delice Bison, DO 05/22/22 0020

## 2022-05-22 ENCOUNTER — Observation Stay: Payer: Medicaid Other

## 2022-05-22 DIAGNOSIS — F419 Anxiety disorder, unspecified: Secondary | ICD-10-CM | POA: Insufficient documentation

## 2022-05-22 DIAGNOSIS — R569 Unspecified convulsions: Secondary | ICD-10-CM

## 2022-05-22 DIAGNOSIS — G40909 Epilepsy, unspecified, not intractable, without status epilepticus: Secondary | ICD-10-CM | POA: Diagnosis not present

## 2022-05-22 DIAGNOSIS — F909 Attention-deficit hyperactivity disorder, unspecified type: Secondary | ICD-10-CM | POA: Insufficient documentation

## 2022-05-22 LAB — URINALYSIS, ROUTINE W REFLEX MICROSCOPIC
Bilirubin Urine: NEGATIVE
Glucose, UA: NEGATIVE mg/dL
Hgb urine dipstick: NEGATIVE
Ketones, ur: NEGATIVE mg/dL
Leukocytes,Ua: NEGATIVE
Nitrite: NEGATIVE
Protein, ur: NEGATIVE mg/dL
Specific Gravity, Urine: 1.028 (ref 1.005–1.030)
WBC, UA: NONE SEEN WBC/hpf (ref 0–5)
pH: 5 (ref 5.0–8.0)

## 2022-05-22 LAB — URINE DRUG SCREEN, QUALITATIVE (ARMC ONLY)
Amphetamines, Ur Screen: NOT DETECTED
Barbiturates, Ur Screen: NOT DETECTED
Benzodiazepine, Ur Scrn: NOT DETECTED
Cannabinoid 50 Ng, Ur ~~LOC~~: NOT DETECTED
Cocaine Metabolite,Ur ~~LOC~~: NOT DETECTED
MDMA (Ecstasy)Ur Screen: NOT DETECTED
Methadone Scn, Ur: NOT DETECTED
Opiate, Ur Screen: NOT DETECTED
Phencyclidine (PCP) Ur S: NOT DETECTED
Tricyclic, Ur Screen: NOT DETECTED

## 2022-05-22 LAB — CBC
HCT: 33.6 % — ABNORMAL LOW (ref 36.0–46.0)
Hemoglobin: 11.1 g/dL — ABNORMAL LOW (ref 12.0–15.0)
MCH: 29.7 pg (ref 26.0–34.0)
MCHC: 33 g/dL (ref 30.0–36.0)
MCV: 89.8 fL (ref 80.0–100.0)
Platelets: 206 10*3/uL (ref 150–400)
RBC: 3.74 MIL/uL — ABNORMAL LOW (ref 3.87–5.11)
RDW: 14.4 % (ref 11.5–15.5)
WBC: 4.4 10*3/uL (ref 4.0–10.5)
nRBC: 0 % (ref 0.0–0.2)

## 2022-05-22 LAB — CREATININE, SERUM
Creatinine, Ser: 0.63 mg/dL (ref 0.44–1.00)
GFR, Estimated: >60 mL/min (ref >60)

## 2022-05-22 LAB — HIV ANTIBODY (ROUTINE TESTING W REFLEX): HIV Screen 4th Generation wRfx: NONREACTIVE

## 2022-05-22 MED ORDER — ORAL CARE MOUTH RINSE
15.0000 mL | OROMUCOSAL | Status: DC
  Filled 2022-05-22 (×10): qty 15

## 2022-05-22 MED ORDER — ORAL CARE MOUTH RINSE: 15.0000 mL | OROMUCOSAL | Status: DC | PRN

## 2022-05-22 MED ORDER — LORAZEPAM 2 MG/ML IJ SOLN: 2.0000 mg | INTRAMUSCULAR | Status: DC | PRN

## 2022-05-22 MED ORDER — CARBAMAZEPINE 100 MG PO CHEW: 50.0000 mg | CHEWABLE_TABLET | Freq: Two times a day (BID) | ORAL | 2 refills | Status: DC

## 2022-05-22 MED ORDER — ACETAMINOPHEN 650 MG RE SUPP: 650.0000 mg | RECTAL | Status: DC | PRN

## 2022-05-22 MED ORDER — ALBUTEROL SULFATE (2.5 MG/3ML) 0.083% IN NEBU: 2.5000 mg | INHALATION_SOLUTION | RESPIRATORY_TRACT | Status: DC | PRN

## 2022-05-22 MED ORDER — LEVETIRACETAM 500 MG PO TABS
500.0000 mg | ORAL_TABLET | Freq: Two times a day (BID) | ORAL | Status: DC
  Administered 2022-05-22: 500 mg via ORAL
  Filled 2022-05-22: qty 1

## 2022-05-22 MED ORDER — ACETAMINOPHEN 325 MG PO TABS: 650.0000 mg | ORAL_TABLET | ORAL | Status: DC | PRN

## 2022-05-22 MED ORDER — CARBAMAZEPINE ER 100 MG PO TB12
100.0000 mg | ORAL_TABLET | Freq: Every day | ORAL | Status: DC
  Administered 2022-05-22: 100 mg via ORAL
  Filled 2022-05-22: qty 1

## 2022-05-22 MED ORDER — ENOXAPARIN SODIUM 40 MG/0.4ML IJ SOSY
40.0000 mg | PREFILLED_SYRINGE | INTRAMUSCULAR | Status: DC
  Administered 2022-05-22: 40 mg via SUBCUTANEOUS
  Filled 2022-05-22: qty 0.4

## 2022-05-22 NOTE — Assessment & Plan Note (Signed)
Albuterol as needed 

## 2022-05-22 NOTE — ED Notes (Signed)
Pt aroused via voice from sleep. RN introduced self and completed assessment. Pt has family member sleeping in chair next to R side of bed with bed rail down. Pt prefers to have significant other there with bed rail down. Pt axox4 and was updated on plan of care. Pt informed need for urine sample and asked to give sample. Pt states she does not have to pee at this time. Pt instructed to use call light for assistance with getting up to bathroom when she needs to go. Pt agreeable.

## 2022-05-22 NOTE — Consult Note (Signed)
NEURO HOSPITALIST CONSULT NOTE   Requesting physician: Dr. Billie Ruddy  Reason for Consult: Breakthrough seizures in the setting of medication noncompliance  History obtained from:  Patient and Chart     HPI:                                                                                                                                          Maureen George is an 27 y.o. homeless female with a history of seizures, ADHD and anxiety, seen by Neurology for the first time in December 2023, started on Tegretol 50 mg twice daily at that time, who presents to the ED after having 5 seizures. She was incontinent of stool but not of urine. There was no evidence for tongue-bite. She was confused postictally. Carbamazepine level in the ED was < 2. CBC and BMP were unremarkable. UDS negative. Pregnancy test negative.   She states that she ran out of her Tegretol at least 2 weeks ago. Denies any EtOH or benzodiazepine use. She seems reluctant to discuss any issues related to possible substance abuse.   She states that she was first diagnosed with seizures in 2018 when she was living in [place]. She was started on Lamictal at that time but had to stop it within about the first month due to rash. She states that she never was started on an alternate anticonvulsant at that time and has been off all seizure meds up until her December admission, despite having had recurrent seizures. She states that she sees Dr. Melrose Nakayama at the [facility] for Neurology follow ups.   She has not taken her Tegretol due to difficulty obtaining refills. She states that her prescriptions have not been sent to the correct Pharmacy, which prevents her mother in law from picking up the medications for her. She states that the prescriptions were being sent to [facility] instead of the correct pharmacy at [facility] in [place], [place].   Her seizures are described by her husband as being generalized at onset. She states that  she has prodromal symptoms of lightheadedness prior to some of the seizures, but not all of them. She has no memory of events after the prodromal symptoms.   She was adopted at the age of 44 and has no information regarding her birth parents or their medical histories. She states that she does know that she was born 1 month premature, but does not know if she had any seizures, meningitis or any other medical conditions as an infant.   Past Medical History:  Diagnosis Date   ADHD (attention deficit hyperactivity disorder)    Anxiety    Seizures ([place])     Past Surgical History:  Procedure Laterality Date   WISDOM TOOTH EXTRACTION      History reviewed.  No pertinent family history.             Social History:  reports that she has never smoked. She has never used smokeless tobacco. She reports that she does not currently use alcohol. She reports that she does not use drugs.  Allergies  Allergen Reactions   Lamotrigine Hives and Rash   Latex Rash   Naproxen Rash and Hives    HOME MEDICATIONS:                                                                                                                      No current facility-administered medications on file prior to encounter.   Current Outpatient Medications on File Prior to Encounter  Medication Sig Dispense Refill   albuterol (VENTOLIN HFA) 108 (90 Base) MCG/ACT inhaler Inhale 2 puffs into the lungs every 6 (six) hours as needed for wheezing or shortness of breath.     carbamazepine (TEGRETOL) 100 MG chewable tablet Chew 50 mg by mouth 2 (two) times daily.     hydrOXYzine (ATARAX) 10 MG tablet Take 10 mg by mouth 3 (three) times daily as needed for anxiety.     Doxylamine-Pyridoxine (DICLEGIS) 10-10 MG TBEC Two tablets at bedtime on day 1 and 2; if symptoms persist, take 1 tablet in morning and 2 tablets at bedtime on day 3; if symptoms persist, may increase to 1 tablet in morning, 1 tablet mid-afternoon, and 2 tablets at bedtime  on day 4 for a maximum of 4 tablets per day. Use the minimum dose necessary to control your symptoms. (Patient not taking: Reported on 05/22/2022) 60 tablet 1     ROS:                                                                                                                                       As per HPI. Comprehensive ROS otherwise negative.    Blood pressure 95/64, pulse (!) 49, temperature 98.4 F (36.9 C), resp. rate 14, height 5\' 6"  (1.676 m), weight 50 kg, SpO2 97 %.   General Examination:  Physical Exam HEENT-  [place]/AT   Lungs- Respirations unlabored Extremities- Warm and well-perfused. No edema.   Neurological Examination Mental Status: Awake and alert. Fully oriented. Thought content appropriate.  Speech fluent without evidence of aphasia.  Able to follow all commands without difficulty. Somewhat flattened affect with odd prosodic quality to her speech.  Cranial Nerves: II: Temporal visual fields intact with no extinction to DSS. PERRL. III,IV, VI: No ptosis. EOMI. No nystagmus. V: Temp sensation equal bilaterally VII: Smile symmetric VIII: Hearing intact to voice IX,X: No hypophonia or hoarseness XI: Symmetric XII: Midline tongue extension Motor: RUE: 5/5 RLE: 5/5 LUE: 5/5 LLE: 5/5 Sensory: Temp and FT intact x 4. No extinction to DSS. Deep Tendon Reflexes: 2+ bilateral brachioradialis, biceps and triceps. 4+ bilateral patellar and achilles reflexes (crossed adductor responses). Positive Hoffman's sign bilaterally. Toes downgoing.  Cerebellar: No ataxia with FNF or H-S bilaterally Gait: Deferred   Lab Results: Basic Metabolic Panel: Recent Labs  Lab 05/21/22 1920 05/22/22 0153  NA 137  --   K 3.5  --   CL 112*  --   CO2 22  --   GLUCOSE 93  --   BUN 10  --   CREATININE 0.70 0.63  CALCIUM 9.1  --     CBC: Recent Labs  Lab 05/21/22 1920  05/22/22 0153  WBC 5.3 4.4  HGB 11.3* 11.1*  HCT 34.9* 33.6*  MCV 90.6 89.8  PLT 259 206    Cardiac Enzymes: No results for input(s): "CKTOTAL", "CKMB", "CKMBINDEX", "TROPONINI" in the last 168 hours.  Lipid Panel: No results for input(s): "CHOL", "TRIG", "HDL", "CHOLHDL", "VLDL", "LDLCALC" in the last 168 hours.  Imaging: No results found.   Assessment: 27 year old female with a PMHx of seizures presenting to the ED after having breakthrough seizures following 2 weeks of anticonvulsant noncompliance. She was loaded with Keppra 1000 mg IV in the ED and also given a 100 mg chewable tablet of carbamazepine. - Exam reveals hyperreflexia and positive Hoffman's signs bilaterally. May be due to postictal state, but will need MRI brain to rule out white matter disease.  - She follows with Dr. Melrose Nakayama of Neurology as an outpatient.   Recommendations: - MRI brain (ordered) - Continue Tegretol at 50 mg po BID - Inpatient seizure precautions - Outpatient seizure precautions: Per [facility] statutes, patients with seizures are not allowed to drive until  they have been seizure-free for six months. Use caution when using heavy equipment or power tools. Avoid working on ladders or at heights. Take showers instead of baths. Ensure the water temperature is not too high on the home water heater. Do not go swimming alone. When caring for infants or small children, sit down when holding, feeding, or changing them to minimize risk of injury to the child in the event you have a seizure. Also, Maintain good sleep hygiene. Avoid alcohol.   Addendum: - MRI brain: No acute intracranial process. No seizure focus is seen. Small foci of T2 hyperintense signal are seen in the posterior temporal lobes and left frontal lobe, which are nonspecific but could represent sequela of prior insult. These are not in a pattern suggestive of demyelinating disease. - She will need to be discharged with a 1 month supply  of carbamazepine.  - The designated pharmacy for her refills should be changed to the one that is compatible with her transportation arrangements [facility] in [place], [place]).  - From a Neurology standpoint, she can be discharged  home with close outpatient Neurology follow up    Electronically signed: Dr. Kerney Elbe 05/22/2022, 9:46 AM

## 2022-05-22 NOTE — Assessment & Plan Note (Signed)
Not currently on medication 

## 2022-05-22 NOTE — Progress Notes (Incomplete)
{  Select_TRH_Note:[id number]} 

## 2022-05-22 NOTE — ED Notes (Signed)
Pt discharge to home. Pt VSS, GCS 15, NAD. Pt verbalized understanding of discharge instructions with no additional questions at this time.  

## 2022-05-22 NOTE — Discharge Summary (Signed)
Physician Discharge Summary   Maureen George  female DOB: 06/19/1995  [id number]  PCP: [facility] [place] date: 05/21/2022 Discharge date: 05/22/2022  Admitted From: home Disposition:  home CODE STATUS: Full code   Hospital Course:  For full details, please see H&P, progress notes, consult notes and ancillary notes.  Briefly,  Maureen George is a 27 y.o. female with medical history significant for Anxiety, depression, seizure-like activity seen by neurology for the first time in December 2023, started on Tegretol 50 mg twice daily who presented to the ED after having 5 seizures, witnessed by her roommate.  She was incontinent of stool but not urine.  Had no tongue biting and did not have postictal confusion.  States she has not taken the Tegretol for 2 weeks.   * Recurrent seizures ([facility]) Medication non-compliance Subtherapeutic carbamazepine level Loaded with Keppra in the ED and started on Tegretol 100 mg BID. --Neuro consulted, no need for EEG.  MRI brain no acute finding.  Neuro cleared for discharge on Tegretol 50 mg BID. --f/u with outpatient neuro Dr. Melrose Nakayama.   GAD (generalized anxiety disorder) Not currently on medication   Asthma --stable.  Albuterol as needed   Unless noted above, medications under "STOP" list are ones pt was not taking PTA.  Discharge Diagnoses:  Principal Problem:   Recurrent seizures ([place]) Active Problems:   Asthma   GAD (generalized anxiety disorder)     Discharge Instructions:  Allergies as of 05/22/2022       Reactions   Lamotrigine Hives, Rash   Latex Rash   Naproxen Rash, Hives        Medication List     STOP taking these medications    Doxylamine-Pyridoxine 10-10 MG Tbec Commonly known as: Diclegis       TAKE these medications    albuterol 108 (90 Base) MCG/ACT inhaler Commonly known as: VENTOLIN HFA Inhale 2 puffs into the lungs every 6 (six) hours as needed for wheezing or shortness of  breath.   carbamazepine 100 MG chewable tablet Commonly known as: TEGRETOL Chew 0.5 tablets (50 mg total) by mouth 2 (two) times daily.   hydrOXYzine 10 MG tablet Commonly known as: ATARAX Take 10 mg by mouth 3 (three) times daily as needed for anxiety.         Follow-up Information     Anabel Bene, MD Follow up in 2 week(s).   Specialty: Neurology Contact information: [facility] [place] [address] [phone]                 Allergies  Allergen Reactions   Lamotrigine Hives and Rash   Latex Rash   Naproxen Rash and Hives     The results of significant diagnostics from this hospitalization (including imaging, microbiology, ancillary and laboratory) are listed below for reference.   Consultations:   Procedures/Studies: MR BRAIN WO CONTRAST  Result Date: 05/22/2022 CLINICAL DATA:  Seizures, hyper-reflexia and positive Hoffmann sign EXAM: MRI HEAD WITHOUT CONTRAST TECHNIQUE: Multiplanar, multiecho pulse sequences of the brain and surrounding structures were obtained without intravenous contrast. COMPARISON:  10/22/2021 FINDINGS: Brain: The hippocampi are symmetric in size and signal. No heterotopia or evidence of cortical dysgenesis. A previously suspected area of cortical thickening in the inferolateral right frontal lobe is favored to have represented volume averaging. No restricted diffusion to suggest acute or subacute infarct. No acute hemorrhage, mass, mass effect, or midline shift. No hydrocephalus or extra-axial collection. Normal pituitary  and craniocervical junction. No hemosiderin deposition to suggest remote hemorrhage. Small foci of T2 hyperintense signal in the posterior temporal lobes and left frontal lobe, which are nonspecific but could represent sequela of prior insult. Vascular: Normal arterial flow voids. Skull and upper cervical spine: Normal marrow signal. Sinuses/Orbits: Minimal mucosal thickening  and small mucous retention cysts in the maxillary sinuses. No acute finding in the orbits. Other: The mastoid air cells are well aerated. IMPRESSION: 1. No acute intracranial process. No seizure focus is seen. 2. Small foci of T2 hyperintense signal in the posterior temporal lobes and left frontal lobe, which are nonspecific but could represent sequela of prior insult. These are not in a pattern suggestive of demyelinating disease. Electronically Signed   By: Merilyn Baba M.D.   On: 05/22/2022 13:40      Labs: BNP (last 3 results) No results for input(s): "BNP" in the last 8760 hours. Basic Metabolic Panel: Recent Labs  Lab 05/21/22 1920 05/22/22 0153  NA 137  --   K 3.5  --   CL 112*  --   CO2 22  --   GLUCOSE 93  --   BUN 10  --   CREATININE 0.70 0.63  CALCIUM 9.1  --    Liver Function Tests: No results for input(s): "AST", "ALT", "ALKPHOS", "BILITOT", "PROT", "ALBUMIN" in the last 168 hours. No results for input(s): "LIPASE", "AMYLASE" in the last 168 hours. No results for input(s): "AMMONIA" in the last 168 hours. CBC: Recent Labs  Lab 05/21/22 1920 05/22/22 0153  WBC 5.3 4.4  HGB 11.3* 11.1*  HCT 34.9* 33.6*  MCV 90.6 89.8  PLT 259 206   Cardiac Enzymes: No results for input(s): "CKTOTAL", "CKMB", "CKMBINDEX", "TROPONINI" in the last 168 hours. BNP: Invalid input(s): "POCBNP" CBG: No results for input(s): "GLUCAP" in the last 168 hours. D-Dimer No results for input(s): "DDIMER" in the last 72 hours. Hgb A1c No results for input(s): "HGBA1C" in the last 72 hours. Lipid Profile No results for input(s): "CHOL", "HDL", "LDLCALC", "TRIG", "CHOLHDL", "LDLDIRECT" in the last 72 hours. Thyroid function studies No results for input(s): "TSH", "T4TOTAL", "T3FREE", "THYROIDAB" in the last 72 hours.  Invalid input(s): "FREET3" Anemia work up No results for input(s): "VITAMINB12", "FOLATE", "FERRITIN", "TIBC", "IRON", "RETICCTPCT" in the last 72 hours. Urinalysis     Component Value Date/Time   COLORURINE YELLOW (A) 05/21/2022 1920   APPEARANCEUR TURBID (A) 05/21/2022 1920   LABSPEC 1.028 05/21/2022 1920   PHURINE 5.0 05/21/2022 1920   GLUCOSEU NEGATIVE 05/21/2022 1920   HGBUR NEGATIVE 05/21/2022 1920   BILIRUBINUR NEGATIVE 05/21/2022 1920   KETONESUR NEGATIVE 05/21/2022 1920   PROTEINUR NEGATIVE 05/21/2022 1920   NITRITE NEGATIVE 05/21/2022 1920   LEUKOCYTESUR NEGATIVE 05/21/2022 1920   Sepsis Labs Recent Labs  Lab 05/21/22 1920 05/22/22 0153  WBC 5.3 4.4   Microbiology No results found for this or any previous visit (from the past 240 hour(s)).   Total time spend on discharging this patient, including the last patient exam, discussing the hospital stay, instructions for ongoing care as it relates to all pertinent caregivers, as well as preparing the medical discharge records, prescriptions, and/or referrals as applicable, is 50 minutes.    Enzo Bi, MD  [facility] 05/22/2022, 3:23 PM

## 2022-05-22 NOTE — Assessment & Plan Note (Addendum)
Subtherapeutic carbamazepine level Loaded with Keppra in the ED and started on carbamazepine 100 Will continue Keppra 500 twice daily and carbamazepine 100 twice daily pending neurology consult Ativan as needed seizure EEG Seizure precautions Neurology consult to follow

## 2022-05-22 NOTE — ED Notes (Signed)
Patient transported to MRI 

## 2022-05-22 NOTE — H&P (Signed)
History and Physical    Patient: Maureen George [id number] DOB: 1995-03-07 DOA: 05/21/2022 DOS: the patient was seen and examined on 05/22/2022 PCP: [facility] [employer]  Patient coming from: Home  Chief Complaint:  Chief Complaint  Patient presents with   Seizures    HPI: Maureen George is a 27 y.o. female with medical history significant for Anxiety, depression, seizure-like activity seen by neurology for the first time in December 2023 , started on Tegretol 50 mg twice daily who presents to the ED after having 5 seizures, witnessed by her roommate.  She was incontinent of stool but not urine.  Had no tongue biting and did not have postictal confusion.  States she has not taken the Tegretol for 2 weeks.  She was previously in her usual state of health. ED course and data review: Vitals within normal limits.  Carbamazepine level less than 2.  CBC and BMP unremarkable.  UDS and urinalysis pending Patient loaded with IV Keppra and given a chewable carbamazepine 100 mg tablet and hospitalist consulted for admission.   Review of Systems: As mentioned in the history of present illness. All other systems reviewed and are negative.  Past Medical History:  Diagnosis Date   ADHD (attention deficit hyperactivity disorder)    Anxiety    Seizures ([place])    Past Surgical History:  Procedure Laterality Date   WISDOM TOOTH EXTRACTION     Social History:  reports that she has never smoked. She has never used smokeless tobacco. She reports that she does not currently use alcohol. She reports that she does not use drugs.  Allergies  Allergen Reactions   Lamotrigine Hives and Rash   Latex Rash   Naproxen Rash and Hives    History reviewed. No pertinent family history.  Prior to Admission medications   Medication Sig Start Date End Date Taking? Authorizing Provider  Doxylamine-Pyridoxine (DICLEGIS) 10-10 MG TBEC Two tablets at bedtime on day 1 and 2; if symptoms persist, take 1 tablet  in morning and 2 tablets at bedtime on day 3; if symptoms persist, may increase to 1 tablet in morning, 1 tablet mid-afternoon, and 2 tablets at bedtime on day 4 for a maximum of 4 tablets per day. Use the minimum dose necessary to control your symptoms. 03/22/18   Hinda Kehr, MD    Physical Exam: Vitals:   05/21/22 1918 05/21/22 2345  BP: 103/63 120/73  Pulse: 76 75  Resp: 16 18  Temp: 98.2 F (36.8 C) 98.4 F (36.9 C)  TempSrc: Oral   SpO2: 98% 100%  Weight: 50 kg   Height: 5\' 6"  (1.676 m)    Physical Exam Vitals and nursing note reviewed.  Constitutional:      General: She is not in acute distress. HENT:     Head: Normocephalic and atraumatic.  Cardiovascular:     Rate and Rhythm: Normal rate and regular rhythm.     Heart sounds: Normal heart sounds.  Pulmonary:     Effort: Pulmonary effort is normal.     Breath sounds: Normal breath sounds.  Abdominal:     Palpations: Abdomen is soft.     Tenderness: There is no abdominal tenderness.  Neurological:     Mental Status: Mental status is at baseline.     Labs on Admission: I have personally reviewed following labs and imaging studies  CBC: Recent Labs  Lab 05/21/22 1920  WBC 5.3  HGB 11.3*  HCT 34.9*  MCV 90.6  PLT 259  Basic Metabolic Panel: Recent Labs  Lab 05/21/22 1920  NA 137  K 3.5  CL 112*  CO2 22  GLUCOSE 93  BUN 10  CREATININE 0.70  CALCIUM 9.1   GFR: Estimated Creatinine Clearance: 82.6 mL/min (by C-G formula based on SCr of 0.7 mg/dL). Liver Function Tests: No results for input(s): "AST", "ALT", "ALKPHOS", "BILITOT", "PROT", "ALBUMIN" in the last 168 hours. No results for input(s): "LIPASE", "AMYLASE" in the last 168 hours. No results for input(s): "AMMONIA" in the last 168 hours. Coagulation Profile: No results for input(s): "INR", "PROTIME" in the last 168 hours. Cardiac Enzymes: No results for input(s): "CKTOTAL", "CKMB", "CKMBINDEX", "TROPONINI" in the last 168 hours. BNP (last  3 results) No results for input(s): "PROBNP" in the last 8760 hours. HbA1C: No results for input(s): "HGBA1C" in the last 72 hours. CBG: No results for input(s): "GLUCAP" in the last 168 hours. Lipid Profile: No results for input(s): "CHOL", "HDL", "LDLCALC", "TRIG", "CHOLHDL", "LDLDIRECT" in the last 72 hours. Thyroid Function Tests: No results for input(s): "TSH", "T4TOTAL", "FREET4", "T3FREE", "THYROIDAB" in the last 72 hours. Anemia Panel: No results for input(s): "VITAMINB12", "FOLATE", "FERRITIN", "TIBC", "IRON", "RETICCTPCT" in the last 72 hours. Urine analysis:    Component Value Date/Time   COLORURINE YELLOW (A) 04/07/2022 1131   APPEARANCEUR CLOUDY (A) 04/07/2022 1131   LABSPEC 1.013 04/07/2022 1131   PHURINE 5.0 04/07/2022 1131   GLUCOSEU NEGATIVE 04/07/2022 1131   HGBUR SMALL (A) 04/07/2022 1131   BILIRUBINUR NEGATIVE 04/07/2022 1131   KETONESUR 20 (A) 04/07/2022 1131   PROTEINUR NEGATIVE 04/07/2022 1131   NITRITE NEGATIVE 04/07/2022 1131   LEUKOCYTESUR MODERATE (A) 04/07/2022 1131    Radiological Exams on Admission: No results found.   Data Reviewed: Relevant notes from primary care and specialist visits, past discharge summaries as available in EHR, including Care Everywhere. Prior diagnostic testing as pertinent to current admission diagnoses Updated medications and problem lists for reconciliation ED course, including vitals, labs, imaging, treatment and response to treatment Triage notes, nursing and pharmacy notes and ED provider's notes Notable results as noted in HPI   Assessment and Plan: * Recurrent seizures ([place]) Subtherapeutic carbamazepine level Loaded with Keppra in the ED and started on carbamazepine 100 Will continue Keppra 500 twice daily and carbamazepine 100 twice daily pending neurology consult Ativan as needed seizure EEG Seizure precautions Neurology consult to follow    GAD (generalized anxiety disorder) Not currently on  medication  Asthma Albuterol as needed   DVT prophylaxis: Lovenox  Consults: Neurology, Dr. Cheral Marker  Advance Care Planning: full code  Family Communication: none  Disposition Plan: Back to previous home environment  Severity of Illness: The appropriate patient status for this patient is OBSERVATION. Observation status is judged to be reasonable and necessary in order to provide the required intensity of service to ensure the patient's safety. The patient's presenting symptoms, physical exam findings, and initial radiographic and laboratory data in the context of their medical condition is felt to place them at decreased risk for further clinical deterioration. Furthermore, it is anticipated that the patient will be medically stable for discharge from the hospital within 2 midnights of admission.   Author: Athena Masse, MD 05/22/2022 12:25 AM  For on call review [email or web address].

## 2022-12-20 ENCOUNTER — Ambulatory Visit (HOSPITAL_COMMUNITY)
Admission: EM | Admit: 2022-12-20 | Discharge: 2022-12-21 | Disposition: A | Discharge status: Home / Self Care | Payer: MEDICAID | Attending: Psychiatry | Admitting: Psychiatry

## 2022-12-20 ENCOUNTER — Encounter (HOSPITAL_COMMUNITY): Payer: Self-pay | Admitting: Family Medicine

## 2022-12-20 DIAGNOSIS — Z9151 Personal history of suicidal behavior: Secondary | ICD-10-CM | POA: Insufficient documentation

## 2022-12-20 DIAGNOSIS — R45851 Suicidal ideations: Secondary | ICD-10-CM

## 2022-12-20 DIAGNOSIS — F4323 Adjustment disorder with mixed anxiety and depressed mood: Secondary | ICD-10-CM

## 2022-12-20 DIAGNOSIS — F411 Generalized anxiety disorder: Secondary | ICD-10-CM | POA: Diagnosis not present

## 2022-12-20 DIAGNOSIS — Z91141 Patient's other noncompliance with medication regimen due to financial hardship: Secondary | ICD-10-CM | POA: Insufficient documentation

## 2022-12-20 DIAGNOSIS — Z560 Unemployment, unspecified: Secondary | ICD-10-CM | POA: Insufficient documentation

## 2022-12-20 DIAGNOSIS — G40909 Epilepsy, unspecified, not intractable, without status epilepticus: Secondary | ICD-10-CM

## 2022-12-20 DIAGNOSIS — Z5900 Homelessness unspecified: Secondary | ICD-10-CM | POA: Insufficient documentation

## 2022-12-20 DIAGNOSIS — F4310 Post-traumatic stress disorder, unspecified: Secondary | ICD-10-CM | POA: Insufficient documentation

## 2022-12-20 DIAGNOSIS — T43506A Underdosing of unspecified antipsychotics and neuroleptics, initial encounter: Secondary | ICD-10-CM | POA: Insufficient documentation

## 2022-12-20 DIAGNOSIS — F319 Bipolar disorder, unspecified: Secondary | ICD-10-CM | POA: Insufficient documentation

## 2022-12-20 DIAGNOSIS — Z653 Problems related to other legal circumstances: Secondary | ICD-10-CM | POA: Insufficient documentation

## 2022-12-20 DIAGNOSIS — J45909 Unspecified asthma, uncomplicated: Secondary | ICD-10-CM

## 2022-12-20 LAB — URINALYSIS, ROUTINE W REFLEX MICROSCOPIC
Bilirubin Urine: NEGATIVE
Glucose, UA: NEGATIVE mg/dL
Ketones, ur: NEGATIVE mg/dL
Leukocytes,Ua: NEGATIVE
Nitrite: NEGATIVE
Protein, ur: NEGATIVE mg/dL
Specific Gravity, Urine: 1.027 (ref 1.005–1.030)
pH: 5 (ref 5.0–8.0)

## 2022-12-20 LAB — CBC WITH DIFFERENTIAL/PLATELET
Abs Immature Granulocytes: 0.02 10*3/uL (ref 0.00–0.07)
Basophils Absolute: 0.1 10*3/uL (ref 0.0–0.1)
Basophils Relative: 1 %
Eosinophils Absolute: 0.2 10*3/uL (ref 0.0–0.5)
Eosinophils Relative: 3 %
HCT: 41.3 % (ref 36.0–46.0)
Hemoglobin: 13.4 g/dL (ref 12.0–15.0)
Immature Granulocytes: 0 %
Lymphocytes Relative: 31 %
Lymphs Abs: 1.9 10*3/uL (ref 0.7–4.0)
MCH: 29.6 pg (ref 26.0–34.0)
MCHC: 32.4 g/dL (ref 30.0–36.0)
MCV: 91.4 fL (ref 80.0–100.0)
Monocytes Absolute: 0.3 10*3/uL (ref 0.1–1.0)
Monocytes Relative: 5 %
Neutro Abs: 3.7 10*3/uL (ref 1.7–7.7)
Neutrophils Relative %: 60 %
Platelets: 294 10*3/uL (ref 150–400)
RBC: 4.52 MIL/uL (ref 3.87–5.11)
RDW: 12.6 % (ref 11.5–15.5)
WBC: 6.2 10*3/uL (ref 4.0–10.5)
nRBC: 0 % (ref 0.0–0.2)

## 2022-12-20 LAB — POCT URINE DRUG SCREEN - MANUAL ENTRY (I-SCREEN)
POC Amphetamine UR: NOT DETECTED
POC Buprenorphine (BUP): NOT DETECTED
POC Cocaine UR: NOT DETECTED
POC Marijuana UR: NOT DETECTED
POC Methadone UR: NOT DETECTED
POC Methamphetamine UR: NOT DETECTED
POC Morphine: NOT DETECTED
POC Oxazepam (BZO): NOT DETECTED
POC Oxycodone UR: NOT DETECTED
POC Secobarbital (BAR): NOT DETECTED

## 2022-12-20 LAB — COMPREHENSIVE METABOLIC PANEL
ALT: 18 U/L (ref 0–44)
AST: 17 U/L (ref 15–41)
Albumin: 3.8 g/dL (ref 3.5–5.0)
Alkaline Phosphatase: 81 U/L (ref 38–126)
Anion gap: 8 (ref 5–15)
BUN: 10 mg/dL (ref 6–20)
CO2: 26 mmol/L (ref 22–32)
Calcium: 10.9 mg/dL — ABNORMAL HIGH (ref 8.9–10.3)
Chloride: 106 mmol/L (ref 98–111)
Creatinine, Ser: 0.64 mg/dL (ref 0.44–1.00)
GFR, Estimated: >60 mL/min (ref >60)
Glucose, Bld: 85 mg/dL (ref 70–99)
Potassium: 4.9 mmol/L (ref 3.5–5.1)
Sodium: 140 mmol/L (ref 135–145)
Total Bilirubin: 0.4 mg/dL (ref 0.3–1.2)
Total Protein: 6.6 g/dL (ref 6.5–8.1)

## 2022-12-20 LAB — TSH: TSH: 3.597 u[IU]/mL (ref 0.350–4.500)

## 2022-12-20 LAB — LIPID PANEL
Cholesterol: 120 mg/dL (ref 0–200)
HDL: 46 mg/dL (ref >40)
LDL Cholesterol: 56 mg/dL (ref 0–99)
Total CHOL/HDL Ratio: 2.6 {ratio}
Triglycerides: 88 mg/dL (ref <150)
VLDL: 18 mg/dL (ref 0–40)

## 2022-12-20 LAB — HEMOGLOBIN A1C
Hgb A1c MFr Bld: 5 % (ref 4.8–5.6)
Mean Plasma Glucose: 96.8 mg/dL

## 2022-12-20 LAB — ETHANOL: Alcohol, Ethyl (B): <10 mg/dL (ref <10)

## 2022-12-20 LAB — POC URINE PREG, ED: Preg Test, Ur: NEGATIVE

## 2022-12-20 LAB — POCT PREGNANCY, URINE: Preg Test, Ur: NEGATIVE

## 2022-12-20 MED ORDER — SERTRALINE HCL 50 MG PO TABS
50.0000 mg | ORAL_TABLET | Freq: Every day | ORAL | Status: DC
  Administered 2022-12-20: 50 mg via ORAL
  Filled 2022-12-20: qty 1

## 2022-12-20 MED ORDER — ALBUTEROL SULFATE HFA 108 (90 BASE) MCG/ACT IN AERS: 2.0000 | INHALATION_SPRAY | Freq: Four times a day (QID) | RESPIRATORY_TRACT | Status: DC | PRN

## 2022-12-20 MED ORDER — ALUM & MAG HYDROXIDE-SIMETH 200-200-20 MG/5ML PO SUSP: 30.0000 mL | ORAL | Status: DC | PRN

## 2022-12-20 MED ORDER — HYDROXYZINE HCL 25 MG PO TABS
25.0000 mg | ORAL_TABLET | Freq: Three times a day (TID) | ORAL | Status: DC | PRN
  Administered 2022-12-20 – 2022-12-21 (×2): 25 mg via ORAL
  Filled 2022-12-20 (×2): qty 1

## 2022-12-20 MED ORDER — MAGNESIUM HYDROXIDE 400 MG/5ML PO SUSP: 30.0000 mL | Freq: Every day | ORAL | Status: DC | PRN

## 2022-12-20 MED ORDER — ACETAMINOPHEN 325 MG PO TABS: 650.0000 mg | ORAL_TABLET | Freq: Four times a day (QID) | ORAL | Status: DC | PRN

## 2022-12-20 MED ORDER — CARBAMAZEPINE 100 MG PO CHEW
50.0000 mg | CHEWABLE_TABLET | Freq: Two times a day (BID) | ORAL | Status: DC
  Filled 2022-12-20 (×2): qty 0.5

## 2022-12-20 NOTE — Discharge Instructions (Addendum)
  Safety Plan [name] will reach out to  Candee Furbish or Ivin Booty call 911 or call mobile crisis, or go to nearest emergency room if condition worsens or if suicidal thoughts become active Patients' will follow up with [facility]  for outpatient psychiatric services (therapy/medication management).  The suicide prevention education provided includes the following: Suicide risk factors Suicide prevention and interventions National Suicide Hotline telephone number [facility] assessment telephone number [facility] Emergency Assistance 911 [facility] and/or Residential Mobile Crisis Unit telephone number Request made of family/significant other to:  Candee Furbish Remove weapons (e.g., guns, rifles, knives), all items previously/currently identified as safety concern.   Remove drugs/medications (over the counter, prescriptions, illicit drugs), all items previously/currently identified as a safety concern.
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
## 2022-12-20 NOTE — Progress Notes (Signed)
   12/20/22 1618  [facility] Triage Screening (Walk-ins at [facility] only)  How Did You Hear About [place]? Legal System  What Is the Reason for Your Visit/Call Today? Maureen George is a 27 year old female presenting to [facility] voluntarily escorted by [employer]. Pt is diagnosed with Bipolar 1, PTSD and Depression. Pt reports that she was released from jail on 12/03/2022 and felt like her suicidal thoughts have increased since then. Pt has a current plan to end her life today by slitting her wrists if she was discharged. Pt also reports 3 past suicide attempts (in Jan 2023) by attempting to overdose on her anti depressants. Pt is currently homeless and looking for a place to live. Pt also reports that she is currently taking her prescribed medications, but is out at this time. Pt is looking to be admitted to an inpatient treatment center for "mental stability". Pt lastly mentions she currently does not have a therapist at this time. Pt denies substance use, HI and AVH.  How Long Has This Been Causing You Problems? > than 6 months  Have You Recently Had Any Thoughts About Hurting Yourself? Yes  How long ago did you have thoughts about hurting yourself? today  Are You Planning to Commit Suicide/Harm Yourself At This time? Yes  Have you Recently Had Thoughts About Hurting Someone Maureen George? No  Are You Planning To Harm Someone At This Time? No  Are you currently experiencing any auditory, visual or other hallucinations? No  Have You Used Any Alcohol or Drugs in the Past 24 Hours? No  Do you have any current medical co-morbidities that require immediate attention? No  Clinician description of patient physical appearance/behavior: calm, cooperative  What Do You Feel Would Help You the Most Today? Medication(s);Stress Management;Housing Assistance  If access to [facility] was not available, would you have sought care in the Emergency Department? No  Determination of Need Emergent (2 hours)  Options For Referral Medication  Management;Intensive Outpatient Therapy

## 2022-12-20 NOTE — ED Notes (Signed)
Patient observed/assessed in bed/chair resting quietly appearing in no distress and verbalizing no complaints at this time. Will continue to monitor.  
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
## 2022-12-21 ENCOUNTER — Other Ambulatory Visit: Payer: Self-pay

## 2022-12-21 DIAGNOSIS — R45851 Suicidal ideations: Secondary | ICD-10-CM

## 2022-12-21 HISTORY — DX: Suicidal ideations: R45.851

## 2022-12-21 LAB — CARBAMAZEPINE LEVEL, TOTAL: Carbamazepine Lvl: <2 ug/mL — ABNORMAL LOW (ref 4.0–12.0)

## 2022-12-21 MED ORDER — CARBAMAZEPINE 100 MG PO CHEW: 50.0000 mg | CHEWABLE_TABLET | Freq: Two times a day (BID) | ORAL | 0 refills | Status: DC

## 2022-12-21 MED ORDER — HYDROXYZINE HCL 25 MG PO TABS: 25.0000 mg | ORAL_TABLET | Freq: Two times a day (BID) | ORAL | 0 refills | Status: DC | PRN

## 2022-12-21 MED ORDER — CARBAMAZEPINE 100 MG PO CHEW
50.0000 mg | CHEWABLE_TABLET | Freq: Two times a day (BID) | ORAL | Status: DC
  Administered 2022-12-21: 50 mg via ORAL
  Filled 2022-12-21 (×4): qty 0.5

## 2022-12-21 MED ORDER — ALBUTEROL SULFATE HFA 108 (90 BASE) MCG/ACT IN AERS: 2.0000 | INHALATION_SPRAY | Freq: Four times a day (QID) | RESPIRATORY_TRACT | 0 refills | Status: DC | PRN

## 2022-12-21 MED ORDER — SERTRALINE HCL 50 MG PO TABS: 50.0000 mg | ORAL_TABLET | Freq: Every day | ORAL | 0 refills | Status: DC

## 2022-12-21 NOTE — ED Notes (Signed)
Pt A&O x 4, no distress noted, presents with suicidal thoughts for a long time, recently released from Prison.  Pt calm & cooperative.  Monitoring for safety,

## 2022-12-21 NOTE — ED Notes (Signed)
Pt awake and alert. Flat affect and depressed mood.  Pt denies SI, HI and AVH.  She has been taking medications when they are available.  Awaiting dispo.  Staff will cont to monitor for safety.

## 2022-12-21 NOTE — ED Notes (Signed)
Pt sleeping at present, no distress noted.  Monitoring for safety. 

## 2022-12-21 NOTE — ED Notes (Signed)
Patient received breakfast. 

## 2022-12-21 NOTE — ED Provider Notes (Addendum)
FBC/OBS ASAP Discharge Summary  Date and Time: 12/21/2022 12:58 PM  Name: Maureen George  MRN:  [id number]   Discharge Diagnoses:  Final diagnoses:  GAD (generalized anxiety disorder)  Recurrent seizures (HCC)  Adjustment disorder with mixed anxiety and depressed mood  Passive suicidal ideations    Subjective: "I have worked out a place to live"  Maureen George, 27 y.o., female patient with a history of Mood Disorder, Seizure Disorder, GAD, ADHD seen face to face by this provider, consulted with Dr. Lucianne Muss; and chart reviewed on 12/21/22.  Stay Summary: Maureen George 27 y.o., female patient presented to [facility] as a walk in voluntarily accompanied by law enforcement whom she called after learning that she would be homeless and subsequently became suicidal. Patient has maintained that she had no specific plan to end her life however needed reports that she really desired to get back on her medication as she was discharged from a 33-month sentence in jail on 12/03/2022 and has been off her psychiatric and seizure medication since that time.  She was also able over the night to secure improved housing with her pastor who she gives this Clinical research associate permission to speak with.  Patient reports that she is close to both pastors of her church Riki Rusk # [phone] and pastor Reece Levy [phone]  both have been working to help patient get back on her feet. Patient reports that she also would need therapy and medication management services resources.  Patient will be living in [facility].  Patient has insurance and is in agreement to follow up at [facility] outpatient psychiatry.  Referral will be placed in patient along with Candee Furbish advised to assist patient with follow-up.  Patient is able to contract for safety and wishes to be discharged.  She continues to deny any plans to end her life.  She has always maintained that she has had no homicidal ideations, hallucinations, delusions or paranoia.  Patient  denies any adverse effects to restarting medications yesterday.  During evaluation ASRA GAMBREL is sitting (position) in no acute distress.  She is alert, oriented x 4, calm, cooperative and attentive.  Her mood is euthymic with congruent affect.  She has normal speech, and behavior.  Objectively there is no evidence of psychosis/mania or delusional thinking.  Patient is able to converse coherently, goal directed thoughts, no distractibility, or pre-occupation.  She also denies suicidal/self-harm/homicidal ideation, psychosis, and paranoia.  Patient answered question appropriately.     Total Time spent with patient: 30 minutes  Past Psychiatric History: See HPI   Past Medical History:  Past Medical History:  Diagnosis Date   ADHD (attention deficit hyperactivity disorder)    Anxiety    Seizures (HCC)     Family History: No past  Family Psychiatric History:  Social History:  Social History   Socioeconomic History   Marital status: Married    Spouse name: Not on file   Number of children: Not on file   Years of education: Not on file   Highest education level: Not on file  Occupational History   Not on file  Tobacco Use   Smoking status: Never   Smokeless tobacco: Never  Vaping Use   Vaping status: Never Used  Substance and Sexual Activity   Alcohol use: Not Currently   Drug use: No   Sexual activity: Yes    Birth control/protection: None  Other Topics Concern   Not on file  Social History Narrative   Not on file  Social Determinants of Health   Financial Resource Strain: Not on file  Food Insecurity: No Food Insecurity (05/22/2022)   Hunger Vital Sign    Worried About Running Out of Food in the Last Year: Never true    Ran Out of Food in the Last Year: Never true  Transportation Needs: No Transportation Needs (05/22/2022)   PRAPARE - Administrator, Civil Service (Medical): No    Lack of Transportation (Non-Medical): No  Physical Activity: Not on file   Stress: Not on file  Social Connections: Not on file  Intimate Partner Violence: Not At Risk (05/22/2022)   Humiliation, Afraid, Rape, and Kick questionnaire    Fear of Current or Ex-Partner: No    Emotionally Abused: No    Physically Abused: No    Sexually Abused: No    Tobacco Cessation:  N/A, patient does not currently use tobacco products  Current Medications:  Current Facility-Administered Medications  Medication Dose Route Frequency Provider Last Rate Last Admin   acetaminophen (TYLENOL) tablet 650 mg  650 mg Oral Q6H PRN Bing Neighbors, NP       albuterol (VENTOLIN HFA) 108 (90 Base) MCG/ACT inhaler 2 puff  2 puff Inhalation Q6H PRN Bing Neighbors, NP       alum & mag hydroxide-simeth (MAALOX/MYLANTA) 200-200-20 MG/5ML suspension 30 mL  30 mL Oral Q4H PRN Bing Neighbors, NP       carbamazepine (TEGRETOL) chewable tablet 50 mg  50 mg Oral Q12H Sindy Guadeloupe, NP   50 mg at 12/21/22 1610   hydrOXYzine (ATARAX) tablet 25 mg  25 mg Oral TID PRN Bing Neighbors, NP   25 mg at 12/21/22 9604   magnesium hydroxide (MILK OF MAGNESIA) suspension 30 mL  30 mL Oral Daily PRN Bing Neighbors, NP       sertraline (ZOLOFT) tablet 50 mg  50 mg Oral QHS Bing Neighbors, NP   50 mg at 12/20/22 2144   Current Outpatient Medications  Medication Sig Dispense Refill   albuterol (VENTOLIN HFA) 108 (90 Base) MCG/ACT inhaler Inhale 2 puffs into the lungs every 6 (six) hours as needed for wheezing or shortness of breath.     carbamazepine (TEGRETOL) 100 MG chewable tablet Chew 0.5 tablets (50 mg total) by mouth 2 (two) times daily. 30 tablet 2   hydrOXYzine (ATARAX) 25 MG tablet Take 25 mg by mouth 2 (two) times daily.     sertraline (ZOLOFT) 50 MG tablet Take 50 mg by mouth daily.      PTA Medications:  Facility Ordered Medications  Medication   acetaminophen (TYLENOL) tablet 650 mg   alum & mag hydroxide-simeth (MAALOX/MYLANTA) 200-200-20 MG/5ML suspension 30 mL   magnesium  hydroxide (MILK OF MAGNESIA) suspension 30 mL   hydrOXYzine (ATARAX) tablet 25 mg   albuterol (VENTOLIN HFA) 108 (90 Base) MCG/ACT inhaler 2 puff   sertraline (ZOLOFT) tablet 50 mg   carbamazepine (TEGRETOL) chewable tablet 50 mg   PTA Medications  Medication Sig   albuterol (VENTOLIN HFA) 108 (90 Base) MCG/ACT inhaler Inhale 2 puffs into the lungs every 6 (six) hours as needed for wheezing or shortness of breath.   carbamazepine (TEGRETOL) 100 MG chewable tablet Chew 0.5 tablets (50 mg total) by mouth 2 (two) times daily.   hydrOXYzine (ATARAX) 25 MG tablet Take 25 mg by mouth 2 (two) times daily.   sertraline (ZOLOFT) 50 MG tablet Take 50 mg by mouth daily.  No data to display          Flowsheet Row ED from 12/20/2022 in [facility] ED from 05/21/2022 in [facility] Department at [facility] ED from 04/11/2022 in [facility] Emergency Department at [facility]  C-SSRS RISK CATEGORY High Risk No Risk No Risk       Musculoskeletal  Strength & Muscle Tone: within normal limits Gait & Station: normal Patient leans: N/A  Psychiatric Specialty Exam  Presentation  General Appearance:  Appropriate for Environment  Eye Contact: Good  Speech: Garbled  Speech Volume: Normal  Handedness: Right   Mood and Affect  Mood: Dysphoric  Affect: Congruent   Thought Process  Thought Processes: Coherent  Descriptions of Associations:Circumstantial  Orientation:Full (Time, Place and Person)  Thought Content:WDL  Diagnosis of Schizophrenia or Schizoaffective disorder in past: No    Hallucinations:Hallucinations: None  Ideas of Reference:None  Suicidal Thoughts:Suicidal Thoughts: Yes, Passive SI Passive Intent and/or Plan: Without Plan  Homicidal Thoughts:Homicidal Thoughts: No   Sensorium  Memory: Immediate Good; Recent Good; Remote Fair  Judgment: Fair  Insight: Poor   Executive Functions   Concentration: Poor  Attention Span: Fair  Recall: [employer] of Knowledge: Fair  Language: Fair   Psychomotor Activity  Psychomotor Activity: Psychomotor Activity: Normal   Assets  Assets: Manufacturing systems engineer; Desire for Improvement; Resilience   Sleep  Sleep: Sleep: Poor Number of Hours of Sleep: 5   Nutritional Assessment (For OBS and FBC admissions only) Has the patient had a weight loss or gain of 10 pounds or more in the last 3 months?: No Has the patient had a decrease in food intake/or appetite?: No Does the patient have dental problems?: No Does the patient have eating habits or behaviors that may be indicators of an eating disorder including binging or inducing vomiting?: No Has the patient recently lost weight without trying?: 0 Has the patient been eating poorly because of a decreased appetite?: 0 Malnutrition Screening Tool Score: 0    Physical Exam  Physical Exam Vitals reviewed.  Constitutional:      Appearance: Normal appearance.  HENT:     Head: Normocephalic and atraumatic.     Nose: Nose normal.  Eyes:     Extraocular Movements: Extraocular movements intact.  Cardiovascular:     Rate and Rhythm: Normal rate and regular rhythm.  Pulmonary:     Effort: Pulmonary effort is normal.  Skin:    General: Skin is warm and dry.  Neurological:     General: No focal deficit present.     Mental Status: She is alert.     Review of Systems  Psychiatric/Behavioral:  Positive for depression. Negative for hallucinations, memory loss, substance abuse and suicidal ideas. The patient is nervous/anxious. The patient does not have insomnia.     Blood pressure (!) 102/55, pulse 61, temperature 97.6 F (36.4 C), temperature source Oral, resp. rate 18, SpO2 97%. There is no height or weight on file to calculate BMI.  Demographic Factors:  Adolescent or young adult  Loss Factors: NA  Historical Factors: Impulsivity  Risk Reduction Factors:    Living with another person, especially a relative, Positive social support, Positive therapeutic relationship, and Positive coping skills or problem solving skills  Continued Clinical Symptoms:  Previous Psychiatric Diagnoses and Treatments  Cognitive Features That Contribute To Risk:  None    Suicide Risk:  Minimal: No identifiable suicidal ideation.  Patients presenting with no risk factors but with morbid ruminations;  may be classified as minimal risk based on the severity of the depressive symptoms  Plan Of Care/Follow-up recommendations:  Other:  Collateral obtained from patient's pastors who agreed to pick patient up from this facility.  Specifically with Candee Furbish this writer completed safety planning and documented on discharge AVS.  Candee Furbish verbalized understanding and agreement with safety planning as well as patient verbalized understanding.  Patient is able to contract for safety and denies any psychiatric concerns today.  1. GAD (generalized anxiety disorder) - TSH; Standing - TSH - Ambulatory referral to Behavioral Health - hydrOXYzine (ATARAX) 25 MG tablet; Take 1 tablet (25 mg total) by mouth 2 (two) times daily as needed.  Dispense: 60 tablet; Refill: 0  2. Recurrent seizures (HCC) - EKG 12-Lead; Standing - EKG 12-Lead - carbamazepine (TEGRETOL) 100 MG chewable tablet; Chew 0.5 tablets (50 mg total) by mouth 2 (two) times daily.  Dispense: 30 tablet; Refill: 0  3. Adjustment disorder with mixed anxiety and depressed mood - Place in continuous assessment [facility]; Standing - CBC with Differential/Platelet; Standing - Comprehensive metabolic panel; Standing - Hemoglobin A1c; Standing - Ethanol; Standing - Lipid panel; Standing - POC urine preg, ED; Standing - POCT Urine Drug Screen - (I-Screen); Standing - Urinalysis, Routine w reflex microscopic -Urine, Clean Catch; Standing - EKG 12-Lead; Standing - Place in continuous assessment [facility] - CBC with  Differential/Platelet - Comprehensive metabolic panel - Hemoglobin A1c - Ethanol - Lipid panel - POC urine preg, ED - POCT Urine Drug Screen - (I-Screen) - Urinalysis, Routine w reflex microscopic -Urine, Clean Catch - EKG 12-Lead - Pregnancy, urine POC; Standing - Pregnancy, urine POC - TSH; Standing - TSH - carbamazepine (TEGRETOL) 100 MG chewable tablet; Chew 0.5 tablets (50 mg total) by mouth 2 (two) times daily.  Dispense: 30 tablet; Refill: 0 - sertraline (ZOLOFT) 50 MG tablet; Take 1 tablet (50 mg total) by mouth daily.  Dispense: 30 tablet; Refill: 0  4. Passive suicidal ideations -Patient able to contract for safety  -Safety planning completed.  Disposition: Hone to care for self   Joaquin Courts, NP 12/21/2022, 12:58 PM

## 2023-01-19 ENCOUNTER — Telehealth (HOSPITAL_COMMUNITY): Payer: Self-pay | Admitting: Family Medicine

## 2023-01-19 NOTE — Telephone Encounter (Signed)
Called to schedule psychiatry appointment on referral form [facility]. No answer. Voicemail box is full. No email listed. Sending MyChart message.

## 2023-03-07 NOTE — L&D Delivery Note (Cosign Needed)
 OB/GYN Faculty Practice Delivery Note  Maureen George is a 28 y.o. [id number] s/p SVD at [redacted]w[redacted]d. She was admitted for SOL.   ROM: 4h 42m with clear fluid GBS Status: neg Maximum Maternal Temperature: 98.8  Labor Progress: Presented with SOL. Labor was augmented with AROM. She progressed to complete dilation without additional intervention  Delivery Date/Time: 10/08/23 0945 Delivery: Called to room and patient was complete and pushing. Head delivered straight OA. No nuchal cord present. Shoulder and body delivered in usual fashion. Infant with spontaneous cry, placed on mother's abdomen, dried and stimulated. Cord lamped x 2 after 1-minute delay, and cut by father. Cord blood drawn. Placenta delivered spontaneously with gentle cord traction. Fundus firm with massage and Pitocin . Labia, perineum, vagina, and cervix inspected inspected with some membranes noted. Fundal sweep performed. Bleeding noted from the cervix. Figure of 8 suture placed and methergine  and TXA given to assist with hemostasis which was achieved.   Placenta: intact, spontaneous Complications: none Lacerations: left external labial and internal labial abrasions, hemostatic QBL: Analgesia: epidural  Postpartum Planning [x]  message to sent to schedule follow-up  [ ]  needs MMR/Zoster vaccine  Infant: female  APGARs 9/9  2840g  Leeroy Pouch, MD [facility] Family Medicine Fellow, [facility], [facility] 10/08/2023, 10:12 AM

## 2023-03-17 NOTE — ED Notes (Signed)
Bed: 08  Expected date:   Expected time:   Means of arrival:   Comments:  EMS

## 2023-04-10 LAB — OB RESULTS CONSOLE ABO/RH: RH Type: POSITIVE

## 2023-04-10 LAB — CYTOLOGY - PAP

## 2023-04-10 LAB — OB RESULTS CONSOLE RPR: RPR: NONREACTIVE

## 2023-04-10 LAB — OB RESULTS CONSOLE HEPATITIS B SURFACE ANTIGEN: Hepatitis B Surface Ag: NEGATIVE

## 2023-04-10 LAB — OB RESULTS CONSOLE RUBELLA ANTIBODY, IGM: Rubella: NON-IMMUNE/NOT IMMUNE

## 2023-04-10 LAB — OB RESULTS CONSOLE HIV ANTIBODY (ROUTINE TESTING): HIV: NONREACTIVE

## 2023-04-10 LAB — OB RESULTS CONSOLE ANTIBODY SCREEN: Antibody Screen: NEGATIVE

## 2023-04-10 LAB — OB RESULTS CONSOLE GC/CHLAMYDIA
Chlamydia: NEGATIVE
Neisseria Gonorrhea: NEGATIVE

## 2023-04-10 LAB — HEPATITIS C ANTIBODY: HCV Ab: NEGATIVE

## 2023-04-10 NOTE — Progress Notes (Signed)
-------------------------------------------------------------------------------   Summary: Office Visit 04/10/23 -------------------------------------------------------------------------------  Book Caring for You and Your Baby given to patient and reviewed how to contact Clinic and Nurse triage on page 2. Reviewed with patient page 3, Contacting Your Provider.  Reviewed with patient Table of Contents with specific instruction to review Common Concerns and Recommended Treatment on page 12 to be aware of over the counter medications and treatments safe in pregnancy.   Also, QR Code  on different classes that are offered at the hospital throughout their pregnancy was given.
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
## 2023-04-13 NOTE — ED Triage Notes (Signed)
 Pt BIBOCEMS c/o diffuse abdominal pain that started approx 1hr PTA. +nausea but no vomiting. Pt is [redacted] weeks pregnant. Denies vaginal bleeding or discharge.

## 2023-04-17 LAB — PANORAMA PRENATAL TEST FULL PANEL:PANORAMA TEST PLUS 5 ADDITIONAL MICRODELETIONS: FETAL FRACTION: 9.1

## 2023-04-30 ENCOUNTER — Telehealth: Payer: Self-pay

## 2023-04-30 NOTE — Telephone Encounter (Signed)
 This patient is wanting to move her care here, can you please look at her records from [facility] to see what she needs.

## 2023-05-02 ENCOUNTER — Encounter: Payer: Self-pay | Admitting: *Deleted

## 2023-05-02 DIAGNOSIS — Z3490 Encounter for supervision of normal pregnancy, unspecified, unspecified trimester: Secondary | ICD-10-CM | POA: Insufficient documentation

## 2023-05-03 ENCOUNTER — Encounter: Payer: MEDICAID | Admitting: *Deleted

## 2023-05-03 ENCOUNTER — Ambulatory Visit: Payer: MEDICAID | Admitting: Advanced Practice Midwife

## 2023-05-03 ENCOUNTER — Encounter: Payer: Self-pay | Admitting: Advanced Practice Midwife

## 2023-05-03 VITALS — BP 94/60 | HR 119 | Wt 105.4 lb

## 2023-05-03 DIAGNOSIS — Z3482 Encounter for supervision of other normal pregnancy, second trimester: Secondary | ICD-10-CM | POA: Diagnosis not present

## 2023-05-03 DIAGNOSIS — Z3480 Encounter for supervision of other normal pregnancy, unspecified trimester: Secondary | ICD-10-CM

## 2023-05-03 DIAGNOSIS — G40909 Epilepsy, unspecified, not intractable, without status epilepticus: Secondary | ICD-10-CM | POA: Diagnosis not present

## 2023-05-03 DIAGNOSIS — Z8679 Personal history of other diseases of the circulatory system: Secondary | ICD-10-CM | POA: Diagnosis not present

## 2023-05-03 DIAGNOSIS — Z3A15 15 weeks gestation of pregnancy: Secondary | ICD-10-CM

## 2023-05-03 MED ORDER — ONDANSETRON 4 MG PO TBDP: 4.0000 mg | ORAL_TABLET | Freq: Four times a day (QID) | ORAL | 2 refills | Status: DC | PRN

## 2023-05-03 MED ORDER — BLOOD PRESSURE MONITOR MISC: 0 refills | Status: DC

## 2023-05-03 NOTE — Progress Notes (Signed)
 INITIAL OBSTETRICAL VISIT Patient name: Maureen George MRN [id number]  Date of birth: 31-Dec-1995 Chief Complaint:   Initial Prenatal Visit (Still having nausea)  History of Present Illness:   Maureen George is a 28 y.o. G50P2002 Caucasian female at [redacted]w[redacted]d by [place] at 74 weeks with an Estimated Date of Delivery: 10/22/23 being seen today for her initial obstetrical visit.   Her obstetrical history is significant for 2 term SVDs w/o problems.  X seizures, last one 12/24.  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 Transfer from [employer] [facility] Today she reports nausea.     05/03/2023    3:14 PM  Depression screen PHQ 2/9  Decreased Interest 0  Down, Depressed, Hopeless 0  PHQ - 2 Score 0  Altered sleeping 0  Tired, decreased energy 0  Change in appetite 1  Feeling bad or failure about yourself  0  Trouble concentrating 0  Moving slowly or fidgety/restless 0  Suicidal thoughts 0  PHQ-9 Score 1    No LMP recorded. Patient is pregnant. Last pap No results found for: "DIAGPAP" Review of Systems:   Pertinent items are noted in HPI Denies cramping/contractions, leakage of fluid, vaginal bleeding, abnormal vaginal discharge w/ itching/odor/irritation, headaches, visual changes, shortness of breath, chest pain, abdominal pain, severe nausea/vomiting, or problems with urination or bowel movements unless otherwise stated above.  Pertinent History Reviewed:  Reviewed past medical,surgical, social, obstetrical and family history.  Reviewed problem list, medications and allergies. OB History  Gravida Para Term Preterm AB Living  3 2 2   2   SAB IAB Ectopic Multiple Live Births      2    # Outcome Date GA Lbr Len/2nd Weight Sex Type Anes PTL Lv  3 Current           2 Term 10/16/18 [redacted]w[redacted]d  6 lb 7.9 oz (2.945 kg) F Vag-Spont EPI N LIV  1 Term 04/10/14 [redacted]w[redacted]d  6 lb 8 oz (2.948 kg) M Vag-Spont  N LIV   Physical Assessment:   Vitals:   05/03/23 1515  BP: 94/60  Pulse: (!) 119  Weight: 105 lb  6.4 oz (47.8 kg)  Body mass index is 17.01 kg/m.       Physical Examination:  General appearance - well appearing, and in no distress  Mental status - alert, oriented to person, place, and time  Psych:  She has a normal mood and affect  Skin - warm and dry, normal color, no suspicious lesions noted  Chest - effort normal  Heart - normal rate and regular rhythm  Abdomen - soft, nontender  Extremities:  No swelling or varicosities noted   No results found for this or any previous visit (from the past 24 hours).        05/03/2023    3:14 PM  Depression screen PHQ 2/9  Decreased Interest 0  Down, Depressed, Hopeless 0  PHQ - 2 Score 0  Altered sleeping 0  Tired, decreased energy 0  Change in appetite 1  Feeling bad or failure about yourself  0  Trouble concentrating 0  Moving slowly or fidgety/restless 0  Suicidal thoughts 0  PHQ-9 Score 1    No LMP recorded. Patient is pregnant. Last pap No results found for: "DIAGPAP" Review of Systems:   Pertinent items are noted in HPI Denies cramping/contractions, leakage of fluid, vaginal bleeding, abnormal vaginal discharge w/ itching/odor/irritation, headaches, visual changes, shortness of breath, chest pain, abdominal pain, severe nausea/vomiting, or problems with urination  or bowel movements unless otherwise stated above.  Pertinent History Reviewed:  Reviewed past medical,surgical, social, obstetrical and family history.  Reviewed problem list, medications and allergies. OB History  Gravida Para Term Preterm AB Living  3 2 2   2   SAB IAB Ectopic Multiple Live Births      2    # Outcome Date GA Lbr Len/2nd Weight Sex Type Anes PTL Lv  3 Current           2 Term 10/16/18 [redacted]w[redacted]d  6 lb 7.9 oz (2.945 kg) F Vag-Spont EPI N LIV  1 Term 04/10/14 [redacted]w[redacted]d  6 lb 8 oz (2.948 kg) M Vag-Spont  N LIV   Physical Assessment:   Vitals:   05/03/23 1515  BP: 94/60  Pulse: (!) 119  Weight: 105 lb 6.4 oz (47.8 kg)  Body mass index is 17.01  kg/m.       Physical Examination:  General appearance - well appearing, and in no distress  Mental status - alert, oriented to person, place, and time  Psych:  She has a normal mood and affect  Skin - warm and dry, normal color, no suspicious lesions noted  Chest - effort normal  Heart - normal rate and regular rhythm  Abdomen - soft, nontender  Extremities:  No swelling or varicosities noted   No results found for this or any previous visit (from the past 24 hours).   I     05/03/2023    3:14 PM  Depression screen PHQ 2/9  Decreased Interest 0  Down, Depressed, Hopeless 0  PHQ - 2 Score 0  Altered sleeping 0  Tired, decreased energy 0  Change in appetite 1  Feeling bad or failure about yourself  0  Trouble concentrating 0  Moving slowly or fidgety/restless 0  Suicidal thoughts 0  PHQ-9 Score 1        05/03/2023    3:14 PM  GAD 7 : Generalized Anxiety Score  Nervous, Anxious, on Edge 0  Control/stop worrying 0  Worry too much - different things 0  Trouble relaxing 0  Restless 0  Easily annoyed or irritable 0  Afraid - awful might happen 0  Total GAD 7 Score 0      Assessment & Plan:  1) Low-Risk Pregnancy G3P2002 at [redacted]w[redacted]d with an Estimated Date of Delivery: 10/22/23   2) Initial OB visit    1. Supervision of other normal pregnancy, antepartum (Primary)  - [facility] AMB BABYSCRIPTS SCHEDULE OPTIMIZATION - Blood Pressure Monitor MISC; For regular home bp monitoring during pregnancy  Dispense: 1 each; Refill: 0  2. [redacted] weeks gestation of pregnancy  - [facility] AMB BABYSCRIPTS SCHEDULE OPTIMIZATION - Blood Pressure Monitor MISC; For regular home bp monitoring during pregnancy  Dispense: 1 each; Refill: 0 - Ambulatory referral to Neurology - AMB Referral to Cardio Obstetrics  3. Recurrent seizures (HCC)  - Ambulatory referral to Neurology  4. History of first degree heart block  - AMB Referral to Cardio Obstetrics       Meds:  Meds ordered this encounter   Medications   Blood Pressure Monitor MISC    Sig: For regular home bp monitoring during pregnancy    Dispense:  1 each    Refill:  0    Z34.81 Please mail to patient   ondansetron (ZOFRAN-ODT) 4 MG disintegrating tablet    Sig: Take 1 tablet (4 mg total) by mouth every 6 (six) hours as needed for nausea.    Dispense:  30 tablet    Refill:  2    Supervising Provider:   Lazaro Arms [[id number]]    Initial labs reviewed Continue prenatal vitamins Reviewed n/v relief measures and warning s/s to report RX zofran Reviewed recommended weight gain based on pre-gravid BMI Encouraged well-balanced diet CCNC completed> form faxed if has or is planning to apply for medicaid The nature of [facility] with multiple MDs and other Advanced Practice Providers was explained to patient; also emphasized that fellows, residents, and students are part of our team. Doesn't have a home bp cuff. Rx faxed   Jacklyn Shell 5:09 PM

## 2023-05-03 NOTE — Patient Instructions (Addendum)

## 2023-05-22 NOTE — Progress Notes (Signed)
 Order(s) created erroneously. Erroneous order ID: [id number]  Order moved by: CHART CORRECTION ANALYST SEVEN, IDENTITY  Order move date/time: 05/22/2023 1:31 PM  Source Patient: [id number]  Source Contact: 05/03/2023  Destination Patient: [id number]  Destination Contact: 01/06/2021

## 2023-05-30 ENCOUNTER — Encounter: Payer: Self-pay | Admitting: *Deleted

## 2023-05-31 ENCOUNTER — Encounter: Payer: Self-pay | Admitting: Advanced Practice Midwife

## 2023-06-04 ENCOUNTER — Other Ambulatory Visit: Payer: Self-pay | Admitting: Obstetrics & Gynecology

## 2023-06-04 DIAGNOSIS — Z363 Encounter for antenatal screening for malformations: Secondary | ICD-10-CM

## 2023-06-05 ENCOUNTER — Ambulatory Visit: Payer: MEDICAID | Admitting: Women's Health

## 2023-06-05 ENCOUNTER — Encounter: Payer: Self-pay | Admitting: Women's Health

## 2023-06-05 ENCOUNTER — Ambulatory Visit (INDEPENDENT_AMBULATORY_CARE_PROVIDER_SITE_OTHER): Payer: MEDICAID

## 2023-06-05 VITALS — BP 102/66 | HR 105 | Wt 109.0 lb

## 2023-06-05 DIAGNOSIS — Z87440 Personal history of urinary (tract) infections: Secondary | ICD-10-CM | POA: Diagnosis not present

## 2023-06-05 DIAGNOSIS — G40909 Epilepsy, unspecified, not intractable, without status epilepticus: Secondary | ICD-10-CM

## 2023-06-05 DIAGNOSIS — Z3A20 20 weeks gestation of pregnancy: Secondary | ICD-10-CM

## 2023-06-05 DIAGNOSIS — Z363 Encounter for antenatal screening for malformations: Secondary | ICD-10-CM | POA: Diagnosis not present

## 2023-06-05 DIAGNOSIS — Z3480 Encounter for supervision of other normal pregnancy, unspecified trimester: Secondary | ICD-10-CM

## 2023-06-05 DIAGNOSIS — F418 Other specified anxiety disorders: Secondary | ICD-10-CM | POA: Insufficient documentation

## 2023-06-05 DIAGNOSIS — Z09 Encounter for follow-up examination after completed treatment for conditions other than malignant neoplasm: Secondary | ICD-10-CM | POA: Diagnosis not present

## 2023-06-05 DIAGNOSIS — Z3482 Encounter for supervision of other normal pregnancy, second trimester: Secondary | ICD-10-CM | POA: Diagnosis not present

## 2023-06-05 DIAGNOSIS — Z8679 Personal history of other diseases of the circulatory system: Secondary | ICD-10-CM

## 2023-06-05 MED ORDER — ONDANSETRON 4 MG PO TBDP
4.0000 mg | ORAL_TABLET | Freq: Four times a day (QID) | ORAL | 2 refills | Status: DC | PRN
Start: 2023-06-05 — End: 2023-10-10

## 2023-06-05 MED ORDER — SERTRALINE HCL 25 MG PO TABS: 25.0000 mg | ORAL_TABLET | Freq: Every day | ORAL | 6 refills | Status: AC

## 2023-06-05 NOTE — Progress Notes (Signed)
 LOW-RISK PREGNANCY VISIT Patient name: Maureen George MRN [id number]  Date of birth: Dec 03, 1995 Chief Complaint:   Routine Prenatal Visit (Anatomy scan)  History of Present Illness:   Maureen George is a 28 y.o. G33P2002 female at [redacted]w[redacted]d with an Estimated Date of Delivery: 10/22/23 being seen today for ongoing management of a low-risk pregnancy.   Today she reports  needs refill on zofran. Feels needs to be back on zoloft. Was on 150mg  about a year ago. Had recent argument w/ bf and told him she 'wanted to be w/ her dad' who is deceased. No plan to harm herself, no further thoughts. No seizure since Dec. H/o 1st degree heart block dx in [place] Feb 2024. Has heard from [facility] cards referral but trying to figure out best time for appt. . Contractions: Not present. Vag. Bleeding: None.  Movement: Present. denies leaking of fluid.     05/03/2023    3:14 PM  Depression screen PHQ 2/9  Decreased Interest 0  Down, Depressed, Hopeless 0  PHQ - 2 Score 0  Altered sleeping 0  Tired, decreased energy 0  Change in appetite 1  Feeling bad or failure about yourself  0  Trouble concentrating 0  Moving slowly or fidgety/restless 0  Suicidal thoughts 0  PHQ-9 Score 1        05/03/2023    3:14 PM  GAD 7 : Generalized Anxiety Score  Nervous, Anxious, on Edge 0  Control/stop worrying 0  Worry too much - different things 0  Trouble relaxing 0  Restless 0  Easily annoyed or irritable 0  Afraid - awful might happen 0  Total GAD 7 Score 0      Review of Systems:   Pertinent items are noted in HPI Denies abnormal vaginal discharge w/ itching/odor/irritation, headaches, visual changes, shortness of breath, chest pain, abdominal pain, severe nausea/vomiting, or problems with urination or bowel movements unless otherwise stated above. Pertinent History Reviewed:  Reviewed past medical,surgical, social, obstetrical and family history.  Reviewed problem list, medications and allergies. Physical  Assessment:   Vitals:   06/05/23 1550  BP: 102/66  Pulse: (!) 105  Weight: 109 lb (49.4 kg)  Body mass index is 17.59 kg/m.        Physical Examination:   General appearance: Well appearing, and in no distress  Mental status: Alert, oriented to person, place, and time  Skin: Warm & dry  Cardiovascular: Normal heart rate noted  Respiratory: Normal respiratory effort, no distress  Abdomen: Soft, gravid, nontender  Pelvic: Cervical exam deferred         Extremities: Edema: None  Fetal Status:     Movement: Present   [place] 20+1 wks,cephalic,anterior placenta grade 0,normal ovaries,CX length 2.8 cm,SVP of fluid 5.2 cm,EFW 374 g 77%,anatomy complete,no obvious abnormalities   Chaperone: N/A No results found for this or any previous visit (from the past 24 hours).  Assessment & Plan:  1) Low-risk pregnancy G3P2002 at [redacted]w[redacted]d with an Estimated Date of Delivery: 10/22/23   2) Seizures, on Keppra, has neuro appt next month. Last sz in Dec  3) Recent UTI> urine cx poc today  4) Dep/anx> w/ recent thoughts of wanting to be w/ her deceased dad. No further thoughts, no plan. On zoloft in past (150mg  ~35yr ago), wants to restart. Rx zoloft 25mg , understands can take a few weeks to notice improvement. Urgent [facility] referral and sent Jamie message  5) H/O 1st degree heart block> OB cards has been  unable to schedule appt. Pt states trying to figure out best time. Gave her numbers to contact them to schedule   Meds:  Meds ordered this encounter  Medications   ondansetron (ZOFRAN-ODT) 4 MG disintegrating tablet    Sig: Take 1 tablet (4 mg total) by mouth every 6 (six) hours as needed for nausea.    Dispense:  30 tablet    Refill:  2   sertraline (ZOLOFT) 25 MG tablet    Sig: Take 1 tablet (25 mg total) by mouth daily.    Dispense:  30 tablet    Refill:  6   Labs/procedures today: U/S and urine culture  Plan:  Continue routine obstetrical care  Next visit: prefers in person    Reviewed: Preterm  labor symptoms and general obstetric precautions including but not limited to vaginal bleeding, contractions, leaking of fluid and fetal movement were reviewed in detail with the patient.  All questions were answered. Does have home bp cuff. Office bp cuff given: not applicable. Check bp weekly, let us know if consistently >140 and/or >90.  Follow-up: Return in about 4 weeks (around 07/03/2023) for LROB, MD, in person.  Future Appointments  Date Time Provider Department Center  07/03/2023  3:10 PM Lazaro Arms, MD [facility]  07/23/2023  3:15 PM Windell Norfolk, MD [facility] None    Orders Placed This Encounter  Procedures   Urine Culture   OB RESULTS CONSOLE Rubella Antibody   Amb ref to [facility]   Cheral Marker CNM, [facility] 06/05/2023 5:04 PM

## 2023-06-05 NOTE — Patient Instructions (Addendum)
 Maureen George, thank you for choosing our office today! We appreciate the opportunity to meet your healthcare needs. You may receive a short survey by mail, e-mail, or through [employer]. If you are happy with your care we would appreciate if you could take just a few minutes to complete the survey questions. We read all of your comments and take your feedback very seriously. Thank you again for choosing our office.  Center for [facility] Healthcare Team at [facility] at [facility] ([address] [place], [place] [address]) Entrance C, located off of [address] [facility] Free 24/7 valet parking   Call [phone] or [phone] to make an appointment with the [facility] cardiologist for history of heart block  Go to [email or web address] to register for FREE online childbirth classes  Call the office [phone]) or go to [facility] if: You begin to severe cramping Your water breaks.  Sometimes it is a big gush of fluid, sometimes it is just a trickle that keeps getting your panties wet or running down your legs You have vaginal bleeding.  It is normal to have a small amount of spotting if your cervix was checked.   [facility] Pediatricians/Family Doctors [facility]): [address] Colette Ribas, [phone]           [facility]: [address], [phone]                [facility]): [address], [phone] (call to ask if accepting patients) [facility]: [address], [place], [phone]    [facility] Pediatricians/Family Doctors [facility]): [phone] [address], [phone] [facility]: [address] [place], [phone] [facility]: [address], [phone]  [facility] Doctors  [facility]): [phone] [facility]: [address] [phone]   [facility]  Doctors [facility]: [address] [phone]  [facility] Doctors  [facility]: [phone], [phone]  Home Blood Pressure Monitoring for Patients   Your provider has recommended that you check your blood pressure (BP) at least once a week at home. If you do not have a blood pressure cuff at home, one will be provided for you. Contact your provider if you have not received your monitor within 1 week.   Helpful Tips for Accurate Home Blood Pressure Checks  Don't smoke, exercise, or drink caffeine 30 minutes before checking your BP Use the restroom before checking your BP (a full bladder can raise your pressure) Relax in a comfortable upright chair Feet on the ground Left arm resting comfortably on a flat surface at the level of your heart Legs uncrossed Back supported Sit quietly and don't talk Place the cuff on your bare arm Adjust snuggly, so that only two fingertips can fit between your skin and the top of the cuff Check 2 readings separated by at least one minute Keep a log of your BP readings For a visual, please reference this diagram: http://ccnc.care/bpdiagram  Provider Name: [facility]     Phone: [phone]  Zone 1: ALL CLEAR  Continue to monitor your symptoms:  BP reading is less than 140 (top number) or less than 90 (bottom number)  No right upper stomach pain No headaches or seeing spots No feeling nauseated or throwing up No swelling in face and hands  Zone 2: CAUTION Call your  doctor's office for any of the following:  BP reading is greater than 140 (top number) or greater than 90 (bottom number)  Stomach pain under your ribs in the middle or right side Headaches or seeing spots Feeling nauseated or throwing up Swelling in face and hands  Zone 3: EMERGENCY  Seek immediate medical care if you have any of the following:  BP reading is greater than160 (top number) or greater than 110 (bottom number) Severe  headaches not improving with Tylenol Serious difficulty catching your breath Any worsening symptoms from Zone 2     Second Trimester of Pregnancy The second trimester is from week 14 through week 27 (months 4 through 6). The second trimester is often a time when you feel your best. Your body has adjusted to being pregnant, and you begin to feel better physically. Usually, morning sickness has lessened or quit completely, you may have more energy, and you may have an increase in appetite. The second trimester is also a time when the fetus is growing rapidly. At the end of the sixth month, the fetus is about 9 inches long and weighs about 1 pounds. You will likely begin to feel the baby move (quickening) between 16 and 20 weeks of pregnancy. Body changes during your second trimester Your body continues to go through many changes during your second trimester. The changes vary from woman to woman. Your weight will continue to increase. You will notice your lower abdomen bulging out. You may begin to get stretch marks on your hips, abdomen, and breasts. You may develop headaches that can be relieved by medicines. The medicines should be approved by your health care provider. You may urinate more often because the fetus is pressing on your bladder. You may develop or continue to have heartburn as a result of your pregnancy. You may develop constipation because certain hormones are causing the muscles that push waste through your intestines to slow down. You may develop hemorrhoids or swollen, bulging veins (varicose veins). You may have back pain. This is caused by: Weight gain. Pregnancy hormones that are relaxing the joints in your pelvis. A shift in weight and the muscles that support your balance. Your breasts will continue to grow and they will continue to become tender. Your gums may bleed and may be sensitive to brushing and flossing. Dark spots or blotches (chloasma, mask of pregnancy) may  develop on your face. This will likely fade after the baby is born. A dark line from your belly button to the pubic area (linea nigra) may appear. This will likely fade after the baby is born. You may have changes in your hair. These can include thickening of your hair, rapid growth, and changes in texture. Some women also have hair loss during or after pregnancy, or hair that feels dry or thin. Your hair will most likely return to normal after your baby is born.  What to expect at prenatal visits During a routine prenatal visit: You will be weighed to make sure you and the fetus are growing normally. Your blood pressure will be taken. Your abdomen will be measured to track your baby's growth. The fetal heartbeat will be listened to. Any test results from the previous visit will be discussed.  Your health care provider may ask you: How you are feeling. If you are feeling the baby move. If you have had any abnormal symptoms, such as leaking fluid, bleeding, severe headaches, or abdominal cramping. If you are using any tobacco products, including  cigarettes, chewing tobacco, and electronic cigarettes. If you have any questions.  Other tests that may be performed during your second trimester include: Blood tests that check for: Low iron levels (anemia). High blood sugar that affects pregnant women (gestational diabetes) between 90 and 28 weeks. Rh antibodies. This is to check for a protein on red blood cells (Rh factor). Urine tests to check for infections, diabetes, or protein in the urine. An ultrasound to confirm the proper growth and development of the baby. An amniocentesis to check for possible genetic problems. Fetal screens for spina bifida and Down syndrome. HIV (human immunodeficiency virus) testing. Routine prenatal testing includes screening for HIV, unless you choose not to have this test.  Follow these instructions at home: Medicines Follow your health care provider's  instructions regarding medicine use. Specific medicines may be either safe or unsafe to take during pregnancy. Take a prenatal vitamin that contains at least 600 micrograms (mcg) of folic acid. If you develop constipation, try taking a stool softener if your health care provider approves. Eating and drinking Eat a balanced diet that includes fresh fruits and vegetables, whole grains, good sources of protein such as meat, eggs, or tofu, and low-fat dairy. Your health care provider will help you determine the amount of weight gain that is right for you. Avoid raw meat and uncooked cheese. These carry germs that can cause birth defects in the baby. If you have low calcium intake from food, talk to your health care provider about whether you should take a daily calcium supplement. Limit foods that are high in fat and processed sugars, such as fried and sweet foods. To prevent constipation: Drink enough fluid to keep your urine clear or pale yellow. Eat foods that are high in fiber, such as fresh fruits and vegetables, whole grains, and beans. Activity Exercise only as directed by your health care provider. Most women can continue their usual exercise routine during pregnancy. Try to exercise for 30 minutes at least 5 days a week. Stop exercising if you experience uterine contractions. Avoid heavy lifting, wear low heel shoes, and practice good posture. A sexual relationship may be continued unless your health care provider directs you otherwise. Relieving pain and discomfort Wear a good support bra to prevent discomfort from breast tenderness. Take warm sitz baths to soothe any pain or discomfort caused by hemorrhoids. Use hemorrhoid cream if your health care provider approves. Rest with your legs elevated if you have leg cramps or low back pain. If you develop varicose veins, wear support hose. Elevate your feet for 15 minutes, 3-4 times a day. Limit salt in your diet. Prenatal Care Write down your  questions. Take them to your prenatal visits. Keep all your prenatal visits as told by your health care provider. This is important. Safety Wear your seat belt at all times when driving. Make a list of emergency phone numbers, including numbers for family, friends, the hospital, and police and fire departments. General instructions Ask your health care provider for a referral to a local prenatal education class. Begin classes no later than the beginning of month 6 of your pregnancy. Ask for help if you have counseling or nutritional needs during pregnancy. Your health care provider can offer advice or refer you to specialists for help with various needs. Do not use hot tubs, steam rooms, or saunas. Do not douche or use tampons or scented sanitary pads. Do not cross your legs for long periods of time. Avoid cat litter boxes and soil  used by cats. These carry germs that can cause birth defects in the baby and possibly loss of the fetus by miscarriage or stillbirth. Avoid all smoking, herbs, alcohol, and unprescribed drugs. Chemicals in these products can affect the formation and growth of the baby. Do not use any products that contain nicotine or tobacco, such as cigarettes and e-cigarettes. If you need help quitting, ask your health care provider. Visit your dentist if you have not gone yet during your pregnancy. Use a soft toothbrush to brush your teeth and be gentle when you floss. Contact a health care provider if: You have dizziness. You have mild pelvic cramps, pelvic pressure, or nagging pain in the abdominal area. You have persistent nausea, vomiting, or diarrhea. You have a bad smelling vaginal discharge. You have pain when you urinate. Get help right away if: You have a fever. You are leaking fluid from your vagina. You have spotting or bleeding from your vagina. You have severe abdominal cramping or pain. You have rapid weight gain or weight loss. You have shortness of breath with  chest pain. You notice sudden or extreme swelling of your face, hands, ankles, feet, or legs. You have not felt your baby move in over an hour. You have severe headaches that do not go away when you take medicine. You have vision changes. Summary The second trimester is from week 14 through week 27 (months 4 through 6). It is also a time when the fetus is growing rapidly. Your body goes through many changes during pregnancy. The changes vary from woman to woman. Avoid all smoking, herbs, alcohol, and unprescribed drugs. These chemicals affect the formation and growth your baby. Do not use any tobacco products, such as cigarettes, chewing tobacco, and e-cigarettes. If you need help quitting, ask your health care provider. Contact your health care provider if you have any questions. Keep all prenatal visits as told by your health care provider. This is important. This information is not intended to replace advice given to you by your health care provider. Make sure you discuss any questions you have with your health care provider. Document Released: 02/14/2001 Document Revised: 07/29/2015 Document Reviewed: 04/23/2012 Elsevier Interactive Patient Education  2017 [employer].

## 2023-06-05 NOTE — Progress Notes (Signed)
 [place] 20+1 wks,cephalic,anterior placenta grade 0,normal ovaries,CX length 2.8 cm,SVP of fluid 5.2 cm,EFW 374 g 77%,anatomy complete,no obvious abnormalities

## 2023-06-07 LAB — URINE CULTURE

## 2023-07-03 ENCOUNTER — Ambulatory Visit: Payer: MEDICAID | Admitting: Obstetrics & Gynecology

## 2023-07-03 ENCOUNTER — Encounter: Payer: Self-pay | Admitting: Obstetrics & Gynecology

## 2023-07-03 VITALS — BP 93/56 | HR 50 | Wt 114.0 lb

## 2023-07-03 DIAGNOSIS — Z3A24 24 weeks gestation of pregnancy: Secondary | ICD-10-CM | POA: Diagnosis not present

## 2023-07-03 DIAGNOSIS — Z8679 Personal history of other diseases of the circulatory system: Secondary | ICD-10-CM

## 2023-07-03 DIAGNOSIS — G40909 Epilepsy, unspecified, not intractable, without status epilepticus: Secondary | ICD-10-CM | POA: Diagnosis not present

## 2023-07-03 DIAGNOSIS — Z3482 Encounter for supervision of other normal pregnancy, second trimester: Secondary | ICD-10-CM | POA: Diagnosis not present

## 2023-07-03 DIAGNOSIS — Z3480 Encounter for supervision of other normal pregnancy, unspecified trimester: Secondary | ICD-10-CM

## 2023-07-03 NOTE — Progress Notes (Addendum)
   LOW-RISK PREGNANCY VISIT Patient name: Maureen George MRN [id number]  Date of birth: 05-14-1995 Chief Complaint:   Routine Prenatal Visit  History of Present Illness:   Maureen George is a 28 y.o. G72P2002 female at [redacted]w[redacted]d with an Estimated Date of Delivery: 10/22/23 being seen today for ongoing management of a low-risk pregnancy.     05/03/2023    3:14 PM  Depression screen PHQ 2/9  Decreased Interest 0  Down, Depressed, Hopeless 0  PHQ - 2 Score 0  Altered sleeping 0  Tired, decreased energy 0  Change in appetite 1  Feeling bad or failure about yourself  0  Trouble concentrating 0  Moving slowly or fidgety/restless 0  Suicidal thoughts 0  PHQ-9 Score 1    Today she reports no complaints. Contractions: Not present. Vag. Bleeding: None.  Movement: Present. denies leaking of fluid. Review of Systems:   Pertinent items are noted in HPI Denies abnormal vaginal discharge w/ itching/odor/irritation, headaches, visual changes, shortness of breath, chest pain, abdominal pain, severe nausea/vomiting, or problems with urination or bowel movements unless otherwise stated above. Pertinent History Reviewed:  Reviewed past medical,surgical, social, obstetrical and family history.  Reviewed problem list, medications and allergies. Physical Assessment:   Vitals:   07/03/23 1442  BP: (!) 93/56  Pulse: (!) 50  Weight: 114 lb (51.7 kg)  Body mass index is 18.4 kg/m.        Physical Examination:   General appearance: Well appearing, and in no distress  Mental status: Alert, oriented to person, place, and time  Skin: Warm & dry  Cardiovascular: Normal heart rate noted  Respiratory: Normal respiratory effort, no distress  Abdomen: Soft, gravid, nontender  Pelvic: Cervical exam deferred         Extremities:    Fetal Status: Fetal Heart Rate (bpm): 148 Fundal Height: 23 cm Movement: Present    Chaperone: n/a    No results found for this or any previous visit (from the past 24 hours).   Assessment & Plan:  1) Low-risk pregnancy G3P2002 at [redacted]w[redacted]d with an Estimated Date of Delivery: 10/22/23   2) Seizure disorder,    Meds: No orders of the defined types were placed in this encounter.  Labs/procedures today:   Plan:  Continue routine obstetrical care  Next visit: prefers in person      Follow-up: Return in about 4 weeks (around 07/31/2023) for LROB, PN2.  No orders of the defined types were placed in this encounter.   Wendelyn Halter, MD 07/03/2023 3:17 PM
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
## 2023-07-18 ENCOUNTER — Ambulatory Visit: Payer: MEDICAID

## 2023-07-18 DIAGNOSIS — F4321 Adjustment disorder with depressed mood: Secondary | ICD-10-CM

## 2023-07-18 DIAGNOSIS — Z8659 Personal history of other mental and behavioral disorders: Secondary | ICD-10-CM

## 2023-07-18 DIAGNOSIS — F4310 Post-traumatic stress disorder, unspecified: Secondary | ICD-10-CM

## 2023-07-18 NOTE — Patient Instructions (Signed)
 [facility] [address] [place], [place] [address] [phone] (main office) [phone] [facility] office) [email or web address]         To apply for disability (3 options):   Apply in person  [facility] [address] [place], [place] [address] [phone]  2. Apply online at [email or web address]  3. Call [phone]     BRAINSTORMING  Develop a Plan Goals: Provide a way to start conversation about your new life with a baby Assist parents in recognizing and using resources within their reach Help pave the way before birth for an easier period of transition afterwards.  Make a list of the following information to keep in a central location: Full name of Mom and Partner: _____________________________________________ Baby's full name and Date of Birth: ___________________________________________ Home Address: ___________________________________________________________ ________________________________________________________________________ Home Phone: ____________________________________________________________ Parents' cell numbers: _____________________________________________________ ________________________________________________________________________ Name and contact info for OB: ______________________________________________ Name and contact info for Pediatrician:________________________________________ Contact info for Lactation Consultants: ________________________________________  REST and SLEEP *You each need at least 4-5 hours of uninterrupted sleep every day. Write specific names and contact information.* How are you going to rest in the postpartum period? While partner's home? When partner returns to work? When you both return to work? Where will your baby sleep? Who is available to help during the day? Evening? Night? Who could move in for a period to help support  you? What are some ideas to help you get enough sleep? __________________________________________________________________________________________________________________________________________________________________________________________________________________________________________ NUTRITIOUS FOOD AND DRINK *Plan for meals before your baby is born so you can have healthy food to eat during the immediate postpartum period.* Who will look after breakfast? Lunch? Dinner? List names and contact information. Brainstorm quick, healthy ideas for each meal. What can you do before baby is born to prepare meals for the postpartum period? How can others help you with meals? Which grocery stores provide online shopping and delivery? Which restaurants offer take-out or delivery options? ______________________________________________________________________________________________________________________________________________________________________________________________________________________________________________________________________________________________________________________________________________________________________________________________________  CARE FOR MOM *It's important that mom is cared for and pampered in the postpartum period. Remember, the most important ways new mothers need care are: sleep, nutrition, gentle exercise, and time off.* Who can come take care of mom during this period? Make a list of people with their contact information. List some activities that make you feel cared for, rested, and energized? Who can make sure you have opportunities to do these things? Does mom have a space of her very own within your home that's just for her? Make a "[facility]" where she can be comfortable, rest, and renew herself  daily. ______________________________________________________________________________________________________________________________________________________________________________________________________________________________________________________________________________________________________________________________________________________________________________________________________    CARE FOR AND FEEDING BABY *Knowledgeable and encouraging people will offer the best support with regard to feeding your baby.* Educate yourself and choose the best feeding option for your baby. Make a list of people who will guide, support, and be a resource for you as your care for and feed your baby. (Friends that have breastfed or are currently breastfeeding, lactation consultants, breastfeeding support groups, etc.) Consider a postpartum doula. (These websites can give you information: dona.org & https://shea.org/) Seek out local breastfeeding resources like the breastfeeding support group at [employer] or [employer]. ______________________________________________________________________________________________________________________________________________________________________________________________________________________________________________________________________________________________________________________________________________________________________________________________________  Blondell Burgess AND ERRANDS Who can help with a thorough cleaning before baby is born? Make a list of people who will help with housekeeping and chores, like laundry, light cleaning, dishes, bathrooms, etc. Who can run some errands for you? What can you do to make sure you are stocked  with basic supplies before baby is born? Who is going to do the  shopping? ______________________________________________________________________________________________________________________________________________________________________________________________________________________________________________________________________________________________________________________________________________________________________________________________________     Family Adjustment *Nurture yourselves.it helps parents be more loving and allows for better bonding with their child.* What sorts of things do you and partner enjoy doing together? Which activities help you to connect and strengthen your relationship? Make a list of those things. Make a list of people whom you trust to care for your baby so you can have some time together as a couple. What types of things help partner feel connected to Mom? Make a list. What needs will partner have in order to bond with baby? Other children? Who will care for them when you go into labor and while you are in the hospital? Think about what the needs of your older children might be. Who can help you meet those needs? In what ways are you helping them prepare for bringing baby home? List some specific strategies you have for family adjustment. _______________________________________________________________________________________________________________________________________________________________________________________________________________________________________________________________________________________________________________________________________________  SUPPORT *Someone who can empathize with experiences normalizes your problems and makes them more bearable.* Make a list of other friends, neighbors, and/or co-workers you know with infants (and small children, if applicable) with whom you can connect. Make a list of local or online support groups, mom groups, etc. in which you can be  involved. ______________________________________________________________________________________________________________________________________________________________________________________________________________________________________________________________________________________________________________________________________________________________________________________________________  Childcare Plans Investigate and plan for childcare if mom is returning to work. Talk about mom's concerns about her transition back to work. Talk about partner's concerns regarding this transition.  Mental Health *Your mental health is one of the highest priorities for a pregnant or postpartum mom.* 1 in 5 women experience anxiety and/or depression from the time of conception through the first year after birth. Postpartum Mood Disorders are the #1 complication of pregnancy and childbirth and the suffering experienced by these mothers is not necessary! These illnesses are temporary and respond well to treatment, which often includes self-care, social support, talk therapy, and medication when needed. Women experiencing anxiety and depression often say things like: "I'm supposed to be happy.why do I feel so sad?", "Why can't I snap out of it?", "I'm having thoughts that scare me." There is no need to be embarrassed if you are feeling these symptoms: Overwhelmed, anxious, angry, sad, guilty, irritable, hopeless, exhausted but can't sleep You are NOT alone. You are NOT to blame. With help, you WILL be well. Where can I find help? Medical professionals such as your OB, midwife, gynecologist, family practitioner, primary care provider, pediatrician, or mental health providers; [facility] support groups: Feelings After Birth, Breastfeeding Support Group, Baby and Me Group, and Fit 4 Two exercise classes. You have permission to ask for help. It will confirm your feelings, validate your experiences,  share/learn coping strategies, and gain support and encouragement as you heal. You are important! BRAINSTORM Make a list of local resources, including resources for mom and for partner. Identify support groups. Identify people to call late at night - include names and contact info. Talk with partner about perinatal mood and anxiety disorders. Talk with your OB, midwife, and doula about baby blues and about perinatal mood and anxiety disorders. Talk with your pediatrician about perinatal mood and anxiety disorders.   Support & Sanity Savers   What do you really need?  Basics In preparing for a new baby, many expectant parents spend hours shopping for baby clothes, decorating the nursery, and deciding which car seat to buy. Yet most don't think much about what the reality of parenting  a newborn will be like, and what they need to make it through that. So, here is the advice of experienced parents. We know you'll read this, and think "they're exaggerating, I don't really need that." Just trust us  on these, OK? Plan for all of this, and if it turns out you don't need it, come back and teach us  how you did it!  Must-Haves (Once baby's survival needs are met, make sure you attend to your own survival needs!) Sleep An average newborn sleeps 16-18 hours per day, over 6-7 sleep periods, rarely more than three hours at a time. It is normal and healthy for a newborn to wake throughout the night... but really hard on parents!! Naps. Prioritize sleep above any responsibilities like: cleaning house, visiting friends, running errands, etc.  Sleep whenever baby sleeps. If you can't nap, at least have restful times when baby eats. The more rest you get, the more patient you will be, the more emotionally stable, and better at solving problems.  Food You may not have realized it would be difficult to eat when you have a newborn. Yet, when we talk to countless new parents, they say things like "it may be 2:00 pm  when I realize I haven't had breakfast yet." Or "every time we sit down to dinner, baby needs to eat, and my food gets cold, so I don't bother to eat it." Finger food. Before your baby is born, stock up with one months' worth of food that: 1) you can eat with one hand while holding a baby, 2) doesn't need to be prepped, 3) is good hot or cold, 4) doesn't spoil when left out for a few hours, and 5) you like to eat. Think about: nuts, dried fruit, Clif bars, pretzels, jerky, gogurt, baby carrots, apples, bananas, crackers, cheez-n-crackers, string cheese, hot pockets or frozen burritos to microwave, garden burgers and breakfast pastries to put in the toaster, yogurt drinks, etc. Restaurant Menus. Make lists of your favorite restaurants & menu items. When family/friends want to help, you can give specific information without much thought. They can either bring you the food or send gift cards for just the right meals. Freezer Meals.  Take some time to make a few meals to put in the freezer ahead of time.  Easy to freeze meals can be anything such as soup, lasagna, chicken pie, or spaghetti sauce. Set up a Meal Schedule.  Ask friends and family to sign up to bring you meals during the first few weeks of being home. (It can be passed around at baby showers!) You have no idea how helpful this will be until you are in the throes of parenting.  MachineLive.it is a great website to check out. Emotional Support Know who to call when you're stressed out. Parenting a newborn is very challenging work. There are times when it totally overwhelms your normal coping abilities. EVERY NEW PARENT NEEDS TO HAVE A PLAN FOR WHO TO CALL WHEN THEY JUST CAN'T COPE ANY MORE. (And it has to be someone other than the baby's other parent!) Before your baby is born, come up with at least one person you can call for support - write their phone number down and post it on the refrigerator. Anxiety & Sadness. Baby blues are normal after  pregnancy; however, there are more severe types of anxiety & sadness which can occur and should not be ignored.  They are always treatable, but you have to take the first step by reaching out  for help. [facility] offers a "Mom Talk" group which meets every Tuesday from 10 am - 11 am.  This group is for new moms who need support and connection after their babies are born.  Call [phone].  Really, Really Helpful (Plan for them! Make sure these happen often!!) Physical Support with Taking Care of Yourselves Asking friends and family. Before your baby is born, set up a schedule of people who can come and visit and help out (or ask a friend to schedule for you). Any time someone says "let me know what I can do to help," sign them up for a day. When they get there, their job is not to take care of the baby (that's your job and your joy). Their job is to take care of you!  Postpartum doulas. If you don't have anyone you can call on for support, look into postpartum doulas:  professionals at helping parents with caring for baby, caring for themselves, getting breastfeeding started, and helping with household tasks. www.padanc.org is a helpful website for learning about doulas in our area. Peer Support / Parent Groups Why: One of the greatest ideas for new parents is to be around other new parents. Parent groups give you a chance to share and listen to others who are going through the same season of life, get a sense of what is normal infant development by watching several babies learn and grow, share your stories of triumph and struggles with empathetic ears, and forgive your own mistakes when you realize all parents are learning by trial and error. Where to find: There are many places you can meet other new parents throughout our community.  [facility] offers the following classes for new moms and their little ones:  Baby and Me (Birth to Crawling) and Breastfeeding Support Group. Go to  www.[email or web address].com or call [phone] for more information. Time for your Relationship It's easy to get so caught up in meeting baby's immediate needs that it's hard to find time to connect with your partner, and meet the needs of your relationship. It's also easy to forget what "quality time with your partner" actually looks like. If you take your baby on a date, you'd be amazed how much of your couple time is spent feeding the baby, diapering the baby, admiring the baby, and talking about the baby. Dating: Try to take time for just the two of you. Babysitter tip: Sometimes when moms are breastfeeding a newborn, they find it hard to figure out how to schedule outings around baby's unpredictable feeding schedules. Have the babysitter come for a three hour period. When she comes over, if baby has just eaten, you can leave right away, and come back in two hours. If baby hasn't fed recently, you start the date at home. Once baby gets hungry and gets a good feeding in, you can head out for the rest of your date time. Date Nights at Home: If you can't get out, at least set aside one evening a week to prioritize your relationship: whenever baby dozes off or doesn't have any immediate needs, spend a little time focusing on each other. Potential conflicts: The main relationship conflicts that come up for new parents are: issues related to sexuality, financial stresses, a feeling of an unfair division of household tasks, and conflicts in parenting styles. The more you can work on these issues before baby arrives, the better!  Fun and Frills (Don't forget these. and don't feel guilty for indulging in them!) Everyone  has something in life that is a fun little treat that they do just for themselves. It may be: reading the morning paper, or going for a daily jog, or having coffee with a friend once a week, or going to a movie on Friday nights, or fine chocolates, or bubble baths, or curling up with a good  book. Unless you do fun things for yourself every now and then, it's hard to have the energy for fun with your baby. Whatever your "special" treats are, make sure you find a way to continue to indulge in them after your baby is born. These special moments can recharge you, and allow you to return to baby with a new joy   PERINATAL MOOD DISORDERS: MATERNAL MENTAL HEALTH FROM CONCEPTION THROUGH THE POSTPARTUM PERIOD   _________________________________________Emergency and Crisis Resources If you are an imminent risk to self or others, are experiencing intense personal distress, and/or have noticed significant changes in activities of daily living, call:  911 [facility]: [phone]  [address], [place], [place], [address] Mobile Crisis: [phone] National Suicide Hotline: 71 Or visit the following crisis centers: Local Emergency Departments [facility]:  [address] [place]  Mon-Friday 8am-3pm, [phone]                                                                                  ___________ Non-Crisis Resources To identify specific providers that are covered by your insurance, contact your insurance company or local agencies:   Postpartum Support International- Warm-line: 216-360-7336                                                      __Outpatient Therapy and Medication Management   Providers:  [facility]: [phone] Hours: 9AM-5PM  Insurance Accepted: Babetta Lesch, BCBS, Karsten Page, [place], Medicare  [facility] Care): [phone] Hours: 8AM-5:30PM  nsurance Accepted: All insurances EXCEPT AARP, [place], [place], and [employer] of the [place]: [phone] Hours: 8AM-8PM Insurance Accepted: Tiffany Foerster, Karsten Page, [place], Medicare, [phone] [facility]: [phone] Hours: 8:30AM-7PM Insurance Accepted: Tiffany Foerster, Medicaid, Medicare, Tricare, [employer] [facility]:  [phone]   Hours:9AM-5PM Insurance Accepted:  Raina Bunting, Mabeline Savant, [employer], [place], [employer] Care  [facility]: [phone] Hours: 9AM-5:30PM Insurance Accepted: Babetta Lesch, Jaylene Metz, and Medicaid, Medicare, [facility]:  [phone] Hours: 9am-5pm Insurance Accepted: BCBS; they do not accept Medicaid/Medicare [facility]: [phone] Hours: 9am-9pm Insurance Accepted: All major insurance including Medicaid and Medicare [facility]: [phone] Hours: 9AM- 5PM Insurance Accepted: All insurances EXCEPT Medicaid and Medicare. [facility]: [phone]   ____________  Parenting Support Groups [facility] :  [address] [place], [place], [address] [phone] [facility]:  [phone] Family Support Network: (support for children in the NICU and/or with special needs), [phone]     _____________                                                                                  Online Resources Postpartum Support International: SeekAlumni.co.za  800-944-4PPD Supporting Moms:  www.momssupportingmoms.net

## 2023-07-23 ENCOUNTER — Encounter: Payer: Self-pay | Admitting: Neurology

## 2023-07-23 ENCOUNTER — Ambulatory Visit: Payer: MEDICAID | Admitting: Neurology

## 2023-08-01 ENCOUNTER — Ambulatory Visit: Payer: MEDICAID | Admitting: Advanced Practice Midwife

## 2023-08-01 ENCOUNTER — Other Ambulatory Visit: Payer: MEDICAID

## 2023-08-01 ENCOUNTER — Encounter: Payer: Self-pay | Admitting: Advanced Practice Midwife

## 2023-08-01 VITALS — BP 94/63 | HR 66 | Wt 112.0 lb

## 2023-08-01 DIAGNOSIS — Z3480 Encounter for supervision of other normal pregnancy, unspecified trimester: Secondary | ICD-10-CM

## 2023-08-01 DIAGNOSIS — Z131 Encounter for screening for diabetes mellitus: Secondary | ICD-10-CM

## 2023-08-01 DIAGNOSIS — O26843 Uterine size-date discrepancy, third trimester: Secondary | ICD-10-CM | POA: Diagnosis not present

## 2023-08-01 DIAGNOSIS — Z3A28 28 weeks gestation of pregnancy: Secondary | ICD-10-CM | POA: Diagnosis not present

## 2023-08-01 MED ORDER — LEVETIRACETAM 500 MG PO TABS: 500.0000 mg | ORAL_TABLET | Freq: Two times a day (BID) | ORAL | 11 refills | Status: DC

## 2023-08-01 NOTE — Progress Notes (Signed)
   LOW-RISK PREGNANCY VISIT Patient name: Maureen George MRN [id number]  Date of birth: [birth year] Chief Complaint:   Routine Prenatal Visit (PN2, information tdap given)  History of Present Illness:   Maureen George is a 28 y.o. G49P2002 female at [redacted]w[redacted]d with an Estimated Date of Delivery: 10/22/23 being seen today for ongoing management of a low-risk pregnancy.  Today she reports no complaints. Contractions: Not present.  .  Movement: Present. denies leaking of fluid. Review of Systems:   Pertinent items are noted in HPI Denies abnormal vaginal discharge w/ itching/odor/irritation, headaches, visual changes, shortness of breath, chest pain, abdominal pain, severe nausea/vomiting, or problems with urination or bowel movements unless otherwise stated above. Pertinent History Reviewed:  Reviewed past medical,surgical, social, obstetrical and family history.  Reviewed problem list, medications and allergies. Physical Assessment:   Vitals:   08/01/23 0858  BP: 94/63  Pulse: 66  Weight: 112 lb (50.8 kg)  Body mass index is 18.08 kg/m.        Physical Examination:   General appearance: Well appearing, and in no distress  Mental status: Alert, oriented to person, place, and time  Skin: Warm & dry  Cardiovascular: Normal heart rate noted  Respiratory: Normal respiratory effort, no distress  Abdomen: Soft, gravid, nontender  Pelvic: Cervical exam deferred         Extremities: Edema: None  Fetal Status: Fetal Heart Rate (bpm): 152 Fundal Height: 23 cm Movement: Present    No results found for this or any previous visit (from the past 24 hours).  Assessment & Plan:  1) Low-risk pregnancy G3P2002 at [redacted]w[redacted]d with an Estimated Date of Delivery: 10/22/23   2) Seizure d/o, stable on bid Keppra  500mg ; last seizure Dec 2024  3) Dep/anx, stable on Zoloft  25mg ; is also speaking w Carolyn Cisco  4) Hx 1st deg heart block, hasn't responded to Cards messages (see note 06/05/23)  5) S<D, will get EFW w next  visit   Meds:  Meds ordered this encounter  Medications   levETIRAcetam  (KEPPRA ) 500 MG tablet    Sig: Take 1 tablet (500 mg total) by mouth 2 (two) times daily.    Dispense:  60 tablet    Refill:  11    Supervising Provider:   Keene Pastures [[id number]]   Labs/procedures today: PN2; wants Tdap @ next visit  Plan:  Continue routine obstetrical care with growth u/s  Reviewed: Preterm labor symptoms and general obstetric precautions including but not limited to vaginal bleeding, contractions, leaking of fluid and fetal movement were reviewed in detail with the patient.  All questions were answered. Has home bp cuff. Check bp weekly, let us  know if >140/90.   Follow-up: Return in about 2 weeks (around 08/15/2023) for LROB, in person, US : EFW.  Orders Placed This Encounter  Procedures   US  OB Follow Up   Jolayne Natter [facility] 08/01/2023 9:32 AM

## 2023-08-01 NOTE — Patient Instructions (Signed)
 Maureen George, thank you for choosing our office today! We appreciate the opportunity to meet your healthcare needs. You may receive a short survey by mail, e-mail, or through [employer]. If you are happy with your care we would appreciate if you could take just a few minutes to complete the survey questions. We read all of your comments and take your feedback very seriously. Thank you again for choosing our office.  Center for [employer] Team at [facility]  [facility] at [facility] ([address] [place], [place] [address]) Entrance C, located off of [address] Free 24/7 valet parking   CLASSES: Go to [email or web address] to register for classes (childbirth, breastfeeding, waterbirth, infant CPR, daddy bootcamp, etc.)  Call the office [phone]) or go to [facility] if: You begin to have strong, frequent contractions Your water breaks.  Sometimes it is a big gush of fluid, sometimes it is just a trickle that keeps getting your panties wet or running down your legs You have vaginal bleeding.  It is normal to have a small amount of spotting if your cervix was checked.  You don't feel your baby moving like normal.  If you don't, get you something to eat and drink and lay down and focus on feeling your baby move.   If your baby is still not moving like normal, you should call the office or go to [facility].  Call the office [phone]) or go to [facility] hospital for these signs of pre-eclampsia: Severe headache that does not go away with Tylenol  Visual changes- seeing spots, double, blurred vision Pain under your right breast or upper abdomen that does not go away with Tums or heartburn medicine Nausea and/or vomiting Severe swelling in your hands, feet, and face   Tdap Vaccine It is recommended that you get the Tdap vaccine during the third trimester of EACH pregnancy to help protect your baby from getting pertussis (whooping cough) 27-36 weeks is the BEST time to do  this so that you can pass the protection on to your baby. During pregnancy is better than after pregnancy, but if you are unable to get it during pregnancy it will be offered at the hospital.  You can get this vaccine with us , at the health department, your family doctor, or some local pharmacies Everyone who will be around your baby should also be up-to-date on their vaccines before the baby comes. Adults (who are not pregnant) only need 1 dose of Tdap during adulthood.   [facility] Pediatricians/Family Doctors [facility]): [address] Meg Spina, [phone]           [facility]: [address], [phone]                [facility]): [address], [phone] (call to ask if accepting patients) [facility] Department: [address], [place], [phone]    [facility] Pediatricians/Family Doctors [facility]): [phone] [address], [phone] [facility]: [address] [place], [phone] [facility]: [address], [phone]  [facility] Doctors  [facility]): [phone] [facility]: [address], [phone]   [facility] Doctors [facility]: [address], [phone]  [facility] Family Doctors  [facility]: [phone], [phone]  Home Blood Pressure Monitoring for Patients   Your provider has recommended that you check your  blood pressure (BP) at least once a week at home. If you do not have a blood pressure cuff at home, one will be provided for you. Contact your provider if you have not received your monitor within 1 week.   Helpful Tips for Accurate Home Blood Pressure Checks  Don't smoke, exercise, or drink caffeine 30 minutes before checking your BP Use the restroom before checking your BP (a full bladder can raise your  pressure) Relax in a comfortable upright chair Feet on the ground Left arm resting comfortably on a flat surface at the level of your heart Legs uncrossed Back supported Sit quietly and don't talk Place the cuff on your bare arm Adjust snuggly, so that only two fingertips can fit between your skin and the top of the cuff Check 2 readings separated by at least one minute Keep a log of your BP readings For a visual, please reference this diagram: http://ccnc.care/bpdiagram  Provider Name: [facility]     Phone: [phone]  Zone 1: ALL CLEAR  Continue to monitor your symptoms:  BP reading is less than 140 (top number) or less than 90 (bottom number)  No right upper stomach pain No headaches or seeing spots No feeling nauseated or throwing up No swelling in face and hands  Zone 2: CAUTION Call your doctor's office for any of the following:  BP reading is greater than 140 (top number) or greater than 90 (bottom number)  Stomach pain under your ribs in the middle or right side Headaches or seeing spots Feeling nauseated or throwing up Swelling in face and hands  Zone 3: EMERGENCY  Seek immediate medical care if you have any of the following:  BP reading is greater than160 (top number) or greater than 110 (bottom number) Severe headaches not improving with Tylenol  Serious difficulty catching your breath Any worsening symptoms from Zone 2   Third Trimester of Pregnancy The third trimester is from week 29 through week 42, months 7 through 9. The third trimester is a time when the fetus is growing rapidly. At the end of the ninth month, the fetus is about 20 inches in length and weighs 6-10 pounds.  BODY CHANGES Your body goes through many changes during pregnancy. The changes vary from woman to woman.  Your weight will continue to increase. You can expect to gain 25-35 pounds (11-16 kg) by the end of the pregnancy. You may begin to get stretch marks on your hips, abdomen,  and breasts. You may urinate more often because the fetus is moving lower into your pelvis and pressing on your bladder. You may develop or continue to have heartburn as a result of your pregnancy. You may develop constipation because certain hormones are causing the muscles that push waste through your intestines to slow down. You may develop hemorrhoids or swollen, bulging veins (varicose veins). You may have pelvic pain because of the weight gain and pregnancy hormones relaxing your joints between the bones in your pelvis. Backaches may result from overexertion of the muscles supporting your posture. You may have changes in your hair. These can include thickening of your hair, rapid growth, and changes in texture. Some women also have hair loss during or after pregnancy, or hair that feels dry or thin. Your hair will most likely return to normal after your baby is born. Your breasts will continue to grow and be tender. A yellow discharge may leak from your breasts called colostrum. Your belly button may stick out. You may  feel short of breath because of your expanding uterus. You may notice the fetus "dropping," or moving lower in your abdomen. You may have a bloody mucus discharge. This usually occurs a few days to a week before labor begins. Your cervix becomes thin and soft (effaced) near your due date. WHAT TO EXPECT AT YOUR PRENATAL EXAMS  You will have prenatal exams every 2 weeks until week 36. Then, you will have weekly prenatal exams. During a routine prenatal visit: You will be weighed to make sure you and the fetus are growing normally. Your blood pressure is taken. Your abdomen will be measured to track your baby's growth. The fetal heartbeat will be listened to. Any test results from the previous visit will be discussed. You may have a cervical check near your due date to see if you have effaced. At around 36 weeks, your caregiver will check your cervix. At the same time, your  caregiver will also perform a test on the secretions of the vaginal tissue. This test is to determine if a type of bacteria, Group B streptococcus, is present. Your caregiver will explain this further. Your caregiver may ask you: What your birth plan is. How you are feeling. If you are feeling the baby move. If you have had any abnormal symptoms, such as leaking fluid, bleeding, severe headaches, or abdominal cramping. If you have any questions. Other tests or screenings that may be performed during your third trimester include: Blood tests that check for low iron levels (anemia). Fetal testing to check the health, activity level, and growth of the fetus. Testing is done if you have certain medical conditions or if there are problems during the pregnancy. FALSE LABOR You may feel small, irregular contractions that eventually go away. These are called Braxton Hicks contractions, or false labor. Contractions may last for hours, days, or even weeks before true labor sets in. If contractions come at regular intervals, intensify, or become painful, it is best to be seen by your caregiver.  SIGNS OF LABOR  Menstrual-like cramps. Contractions that are 5 minutes apart or less. Contractions that start on the top of the uterus and spread down to the lower abdomen and back. A sense of increased pelvic pressure or back pain. A watery or bloody mucus discharge that comes from the vagina. If you have any of these signs before the 37th week of pregnancy, call your caregiver right away. You need to go to the hospital to get checked immediately. HOME CARE INSTRUCTIONS  Avoid all smoking, herbs, alcohol, and unprescribed drugs. These chemicals affect the formation and growth of the baby. Follow your caregiver's instructions regarding medicine use. There are medicines that are either safe or unsafe to take during pregnancy. Exercise only as directed by your caregiver. Experiencing uterine cramps is a good sign to  stop exercising. Continue to eat regular, healthy meals. Wear a good support bra for breast tenderness. Do not use hot tubs, steam rooms, or saunas. Wear your seat belt at all times when driving. Avoid raw meat, uncooked cheese, cat litter boxes, and soil used by cats. These carry germs that can cause birth defects in the baby. Take your prenatal vitamins. Try taking a stool softener (if your caregiver approves) if you develop constipation. Eat more high-fiber foods, such as fresh vegetables or fruit and whole grains. Drink plenty of fluids to keep your urine clear or pale yellow. Take warm sitz baths to soothe any pain or discomfort caused by hemorrhoids. Use hemorrhoid cream if  your caregiver approves. If you develop varicose veins, wear support hose. Elevate your feet for 15 minutes, 3-4 times a day. Limit salt in your diet. Avoid heavy lifting, wear low heal shoes, and practice good posture. Rest a lot with your legs elevated if you have leg cramps or low back pain. Visit your dentist if you have not gone during your pregnancy. Use a soft toothbrush to brush your teeth and be gentle when you floss. A sexual relationship may be continued unless your caregiver directs you otherwise. Do not travel far distances unless it is absolutely necessary and only with the approval of your caregiver. Take prenatal classes to understand, practice, and ask questions about the labor and delivery. Make a trial run to the hospital. Pack your hospital bag. Prepare the baby's nursery. Continue to go to all your prenatal visits as directed by your caregiver. SEEK MEDICAL CARE IF: You are unsure if you are in labor or if your water has broken. You have dizziness. You have mild pelvic cramps, pelvic pressure, or nagging pain in your abdominal area. You have persistent nausea, vomiting, or diarrhea. You have a bad smelling vaginal discharge. You have pain with urination. SEEK IMMEDIATE MEDICAL CARE IF:  You  have a fever. You are leaking fluid from your vagina. You have spotting or bleeding from your vagina. You have severe abdominal cramping or pain. You have rapid weight loss or gain. You have shortness of breath with chest pain. You notice sudden or extreme swelling of your face, hands, ankles, feet, or legs. You have not felt your baby move in over an hour. You have severe headaches that do not go away with medicine. You have vision changes. Document Released: 02/14/2001 Document Revised: 02/25/2013 Document Reviewed: 04/23/2012 [facility] Patient Information 2015 [place], [place]. This information is not intended to replace advice given to you by your health care provider. Make sure you discuss any questions you have with your health care provider.

## 2023-08-02 LAB — GLUCOSE TOLERANCE, 2 HOURS W/ 1HR
Glucose, 1 hour: 64 mg/dL — ABNORMAL LOW (ref 70–179)
Glucose, 2 hour: 77 mg/dL (ref 70–152)
Glucose, Fasting: 81 mg/dL (ref 70–91)

## 2023-08-02 LAB — RPR: RPR Ser Ql: NONREACTIVE

## 2023-08-02 LAB — HIV ANTIBODY (ROUTINE TESTING W REFLEX): HIV Screen 4th Generation wRfx: NONREACTIVE

## 2023-08-02 LAB — CBC
Hematocrit: 34.9 % (ref 34.0–46.6)
Hemoglobin: 11.3 g/dL (ref 11.1–15.9)
MCH: 30.9 pg (ref 26.6–33.0)
MCHC: 32.4 g/dL (ref 31.5–35.7)
MCV: 95 fL (ref 79–97)
Platelets: 210 10*3/uL (ref 150–450)
RBC: 3.66 x10E6/uL — ABNORMAL LOW (ref 3.77–5.28)
RDW: 12.8 % (ref 11.7–15.4)
WBC: 5.8 10*3/uL (ref 3.4–10.8)

## 2023-08-02 LAB — ANTIBODY SCREEN: Antibody Screen: NEGATIVE

## 2023-08-04 ENCOUNTER — Ambulatory Visit: Payer: Self-pay | Admitting: Advanced Practice Midwife

## 2023-08-17 ENCOUNTER — Other Ambulatory Visit: Payer: MEDICAID

## 2023-08-17 ENCOUNTER — Encounter: Payer: MEDICAID | Admitting: Obstetrics and Gynecology

## 2023-08-20 ENCOUNTER — Encounter: Payer: MEDICAID | Admitting: Women's Health

## 2023-08-20 ENCOUNTER — Other Ambulatory Visit: Payer: MEDICAID | Admitting: Radiology

## 2023-08-21 ENCOUNTER — Other Ambulatory Visit: Payer: Self-pay | Admitting: Advanced Practice Midwife

## 2023-08-21 DIAGNOSIS — O26843 Uterine size-date discrepancy, third trimester: Secondary | ICD-10-CM

## 2023-08-22 ENCOUNTER — Ambulatory Visit: Payer: MEDICAID | Admitting: Obstetrics & Gynecology

## 2023-08-22 ENCOUNTER — Encounter: Payer: Self-pay | Admitting: Obstetrics & Gynecology

## 2023-08-22 ENCOUNTER — Ambulatory Visit: Payer: MEDICAID | Admitting: Radiology

## 2023-08-22 VITALS — BP 97/66 | Wt 116.0 lb

## 2023-08-22 DIAGNOSIS — Z3A31 31 weeks gestation of pregnancy: Secondary | ICD-10-CM | POA: Diagnosis not present

## 2023-08-22 DIAGNOSIS — Z3483 Encounter for supervision of other normal pregnancy, third trimester: Secondary | ICD-10-CM

## 2023-08-22 DIAGNOSIS — O26843 Uterine size-date discrepancy, third trimester: Secondary | ICD-10-CM

## 2023-08-22 DIAGNOSIS — G40909 Epilepsy, unspecified, not intractable, without status epilepticus: Secondary | ICD-10-CM

## 2023-08-22 DIAGNOSIS — Z3480 Encounter for supervision of other normal pregnancy, unspecified trimester: Secondary | ICD-10-CM

## 2023-08-22 DIAGNOSIS — Z23 Encounter for immunization: Secondary | ICD-10-CM | POA: Diagnosis not present

## 2023-08-22 DIAGNOSIS — Z3A21 21 weeks gestation of pregnancy: Secondary | ICD-10-CM | POA: Diagnosis not present

## 2023-08-22 DIAGNOSIS — F418 Other specified anxiety disorders: Secondary | ICD-10-CM | POA: Diagnosis not present

## 2023-08-22 NOTE — Progress Notes (Signed)
 LOW-RISK PREGNANCY VISIT Patient name: Maureen George MRN [id number]  Date of birth: May 26, 1995 Chief Complaint:   Routine Prenatal Visit  History of Present Illness:   Maureen George is a 28 y.o. G77P2002 female at [redacted]w[redacted]d with an Estimated Date of Delivery: 10/22/23 being seen today for ongoing management of a low-risk pregnancy.   -h/o seizures -h/o 1st deg heart block per pt -Rubella non-immune  -Per pt [facility]- pt states she is already 2cm.  Denies contractions currently -pt notes h/o hypoglycemia     08/01/2023    9:05 AM 07/18/2023    8:26 AM 05/03/2023    3:14 PM  Depression screen PHQ 2/9  Decreased Interest 0 0 0  Down, Depressed, Hopeless 0 1 0  PHQ - 2 Score 0 1 0  Altered sleeping 0 0 0  Tired, decreased energy 1 0 0  Change in appetite 0 0 1  Feeling bad or failure about yourself  0 0 0  Trouble concentrating 0 0 0  Moving slowly or fidgety/restless 0 0 0  Suicidal thoughts 0 0 0  PHQ-9 Score 1 1 1     Today she reports no complaints. Contractions: Not present. Vag. Bleeding: Scant.  Movement: Present. denies leaking of fluid. Review of Systems:   Pertinent items are noted in HPI Denies abnormal vaginal discharge w/ itching/odor/irritation, headaches, visual changes, shortness of breath, chest pain, abdominal pain, severe nausea/vomiting, or problems with urination or bowel movements unless otherwise stated above. Pertinent History Reviewed:  Reviewed past medical,surgical, social, obstetrical and family history.  Reviewed problem list, medications and allergies.  Physical Assessment:   Vitals:   08/22/23 1222  BP: 97/66  Weight: 116 lb (52.6 kg)  Body mass index is 18.72 kg/m.        Physical Examination:   General appearance: Well appearing, and in no distress  Mental status: Alert, oriented to person, place, and time  Skin: Warm & dry  Respiratory: Normal respiratory effort, no distress  Abdomen: Soft, gravid, nontender  Pelvic: Cervical exam  deferred         Extremities: Edema: None  Psych:  mood and affect appropriate  Fetal Status:     Movement: Present  Single active female fetus, complete breech, FHR = 150 bpm, anterior pl high, gr1, AFI = 15.8 cm, 59%, MVP = 5.8 cm, EFW 54%, 1835g, nl ov's cervix appears closed   Chaperone: n/a    No results found for this or any previous visit (from the past 24 hours).   Assessment & Plan:  1) Low-risk pregnancy G3P2002 at [redacted]w[redacted]d with an Estimated Date of Delivery: 10/22/23   2) Small for dates- US  today AGA  3) h/o seizure d/o stable on Keppra  4) Depression/anxiety- taking zoloft  5) h/o 1st degree heart block- has not been seen by cards, pt did not respond to message   Meds: No orders of the defined types were placed in this encounter.  Labs/procedures today: growth scan, Tdap today  Plan:  Continue routine obstetrical care  Next visit: prefers in person    Reviewed: Preterm labor symptoms and general obstetric precautions including but not limited to vaginal bleeding, contractions, leaking of fluid and fetal movement were reviewed in detail with the patient.  All questions were answered.   Follow-up: Return in about 2 weeks (around 09/05/2023) for LROB visit.  Orders Placed This Encounter  Procedures   Tdap vaccine greater than or equal to 7yo IM    Woodford Strege, DO  Attending Obstetrician & Gynecologist, [facility] for [employer], [facility]

## 2023-08-22 NOTE — Progress Notes (Signed)
 US : GA = 31+2 weeks Single active female fetus, complete breech, FHR = 150 bpm, anterior pl high, gr1, AFI = 15.8 cm, 59%, MVP = 5.8 cm, EFW 54%, 1835g, nl ov's   cervix appears closed

## 2023-08-23 ENCOUNTER — Encounter: Payer: Self-pay | Admitting: Obstetrics and Gynecology

## 2023-08-23 ENCOUNTER — Other Ambulatory Visit: Payer: Self-pay

## 2023-08-23 ENCOUNTER — Inpatient Hospital Stay
Admission: EM | Admit: 2023-08-23 | Discharge: 2023-08-25 | DRG: 832 | Disposition: A | Discharge status: Home / Self Care | Payer: MEDICAID

## 2023-08-23 DIAGNOSIS — J45909 Unspecified asthma, uncomplicated: Secondary | ICD-10-CM | POA: Diagnosis present

## 2023-08-23 DIAGNOSIS — Z3A31 31 weeks gestation of pregnancy: Secondary | ICD-10-CM | POA: Diagnosis not present

## 2023-08-23 DIAGNOSIS — Z3480 Encounter for supervision of other normal pregnancy, unspecified trimester: Principal | ICD-10-CM

## 2023-08-23 DIAGNOSIS — O4700 False labor before 37 completed weeks of gestation, unspecified trimester: Secondary | ICD-10-CM | POA: Diagnosis present

## 2023-08-23 DIAGNOSIS — O99513 Diseases of the respiratory system complicating pregnancy, third trimester: Secondary | ICD-10-CM | POA: Diagnosis present

## 2023-08-23 DIAGNOSIS — I959 Hypotension, unspecified: Secondary | ICD-10-CM | POA: Diagnosis not present

## 2023-08-23 DIAGNOSIS — O99353 Diseases of the nervous system complicating pregnancy, third trimester: Secondary | ICD-10-CM | POA: Diagnosis present

## 2023-08-23 DIAGNOSIS — G40909 Epilepsy, unspecified, not intractable, without status epilepticus: Secondary | ICD-10-CM | POA: Diagnosis present

## 2023-08-23 DIAGNOSIS — O479 False labor, unspecified: Secondary | ICD-10-CM | POA: Diagnosis present

## 2023-08-23 DIAGNOSIS — O99413 Diseases of the circulatory system complicating pregnancy, third trimester: Secondary | ICD-10-CM | POA: Diagnosis not present

## 2023-08-23 LAB — RUPTURE OF MEMBRANE (ROM)PLUS: Rom Plus: NEGATIVE

## 2023-08-23 LAB — URINE DRUG SCREEN, QUALITATIVE (ARMC ONLY)
Amphetamines, Ur Screen: NOT DETECTED
Barbiturates, Ur Screen: NOT DETECTED
Benzodiazepine, Ur Scrn: NOT DETECTED
Cannabinoid 50 Ng, Ur ~~LOC~~: NOT DETECTED
Cocaine Metabolite,Ur ~~LOC~~: NOT DETECTED
MDMA (Ecstasy)Ur Screen: NOT DETECTED
Methadone Scn, Ur: NOT DETECTED
Opiate, Ur Screen: NOT DETECTED
Phencyclidine (PCP) Ur S: NOT DETECTED
Tricyclic, Ur Screen: NOT DETECTED

## 2023-08-23 LAB — URINALYSIS, COMPLETE (UACMP) WITH MICROSCOPIC
Bilirubin Urine: NEGATIVE
Glucose, UA: NEGATIVE mg/dL
Hgb urine dipstick: NEGATIVE
Ketones, ur: 20 mg/dL — AB
Leukocytes,Ua: NEGATIVE
Nitrite: NEGATIVE
Protein, ur: NEGATIVE mg/dL
Specific Gravity, Urine: 1.006 (ref 1.005–1.030)
pH: 7 (ref 5.0–8.0)

## 2023-08-23 LAB — GROUP B STREP BY PCR: Group B strep by PCR: NEGATIVE

## 2023-08-23 LAB — FETAL FIBRONECTIN: Fetal Fibronectin: POSITIVE — AB

## 2023-08-23 MED ORDER — PRENATAL MULTIVITAMIN CH
1.0000 | ORAL_TABLET | Freq: Every day | ORAL | Status: AC
Start: 2023-08-24 — End: ?
  Administered 2023-08-24: 1 via ORAL
  Filled 2023-08-23: qty 1

## 2023-08-23 MED ORDER — SODIUM CHLORIDE 0.9% FLUSH: 3.0000 mL | Freq: Two times a day (BID) | INTRAVENOUS | Status: DC

## 2023-08-23 MED ORDER — MAGNESIUM SULFATE 40 GM/1000ML IV SOLN
2.0000 g/h | INTRAVENOUS | Status: DC
  Administered 2023-08-24: 2 g/h via INTRAVENOUS
  Filled 2023-08-23 (×2): qty 1000

## 2023-08-23 MED ORDER — CALCIUM GLUCONATE 10 % IV SOLN
INTRAVENOUS | Status: AC
  Filled 2023-08-23: qty 10

## 2023-08-23 MED ORDER — BETAMETHASONE SOD PHOS & ACET 6 (3-3) MG/ML IJ SUSP
12.0000 mg | INTRAMUSCULAR | Status: AC
  Administered 2023-08-23 – 2023-08-24 (×2): 12 mg via INTRAMUSCULAR
  Filled 2023-08-23: qty 5

## 2023-08-23 MED ORDER — SERTRALINE HCL 25 MG PO TABS
25.0000 mg | ORAL_TABLET | Freq: Every day | ORAL | Status: DC
  Administered 2023-08-23 – 2023-08-25 (×3): 25 mg via ORAL
  Filled 2023-08-23 (×3): qty 1

## 2023-08-23 MED ORDER — SODIUM CHLORIDE 0.9% FLUSH: 3.0000 mL | INTRAVENOUS | Status: DC | PRN

## 2023-08-23 MED ORDER — NIFEDIPINE ER OSMOTIC RELEASE 30 MG PO TB24
30.0000 mg | ORAL_TABLET | Freq: Two times a day (BID) | ORAL | Status: DC
  Administered 2023-08-23: 30 mg via ORAL
  Filled 2023-08-23: qty 1

## 2023-08-23 MED ORDER — SODIUM CHLORIDE 0.9 % IV SOLN
5.0000 10*6.[IU] | Freq: Once | INTRAVENOUS | Status: DC
  Filled 2023-08-23: qty 5

## 2023-08-23 MED ORDER — PENICILLIN G POT IN DEXTROSE 60000 UNIT/ML IV SOLN: 3.0000 10*6.[IU] | INTRAVENOUS | Status: DC

## 2023-08-23 MED ORDER — LACTATED RINGERS IV BOLUS
1000.0000 mL | Freq: Once | INTRAVENOUS | Status: AC
  Administered 2023-08-23: 1000 mL via INTRAVENOUS

## 2023-08-23 MED ORDER — LACTATED RINGERS IV SOLN: INTRAVENOUS | Status: AC

## 2023-08-23 MED ORDER — ACETAMINOPHEN 325 MG PO TABS
650.0000 mg | ORAL_TABLET | ORAL | Status: DC | PRN
  Administered 2023-08-24: 650 mg via ORAL
  Filled 2023-08-23: qty 2

## 2023-08-23 MED ORDER — MAGNESIUM SULFATE BOLUS VIA INFUSION
6.0000 g | Freq: Once | INTRAVENOUS | Status: AC
  Administered 2023-08-23: 6 g via INTRAVENOUS
  Filled 2023-08-23: qty 1000

## 2023-08-23 NOTE — H&P (Cosign Needed Addendum)
 Maureen George is a 28 y.o. [id number] [redacted]w[redacted]d by 10/22/2023, by Ultrasound presenting to L&D for contractions. Contractions started around 3:30 this afternoon, they come every 5 to 15 mins, they are now more intense. While sitting in the wheelchair on her way up form the ED she noticed a large gush of fluid that soaked the wheelchair and her clothes, she has felt a few trickles since then. She last had IC 1 month ago. She did some gardening today, has been sipping on a 42 oz soda while gardening.    Maureen George was seen at [facility] on 6/16 for abdominal pain, she reports she was told she was 2 cm.  Her VE today is 3/50/high and breech. Her FFN was positive. Given that she is contracting on the monitor, has made some cervical change with a positive FFN it was recommended to be admitted for antenatal steroids, Magnesium  Sulfate and Procardia. Pt agreeable.   Maureen George received her prenatal [facility] at [facility] and then transferred to [facility]. Her pregnancy has been complicated by hz of seizures on Kepra, Asthma she last used her inhaler yesterday, Depression/anxiety on Zoloft , hax of first degree heart block-she has not been seen by a Cardiologist this pregnancy. She last had an US  yesterday the growth was    1835  grams, 54 % , her AFI was 15.8.    Pregravid weight 54.9 kg Total Weight Gain -2.268 kg OB History     Gravida  3   Para  2   Term  2   Preterm      AB      Living  2      SAB      IAB      Ectopic      Multiple      Live Births  2          Past Medical History:  Diagnosis Date   ADHD (attention deficit hyperactivity disorder)    Anxiety    Asthma    Depression    Heart murmur    History of first degree heart block 2024   per pt   Mini stroke    PTSD (post-traumatic stress disorder)    Seizures (HCC)    Vaginal Pap smear, abnormal    Past Surgical History:  Procedure Laterality Date   right wrist surgery     WISDOM TOOTH EXTRACTION     Family History: family history  includes Breast cancer in her maternal grandmother; Cancer in her paternal grandmother; Colon cancer in her paternal grandmother; Diabetes in her maternal grandmother and mother. Social History:  reports that she has never smoked. She has never used smokeless tobacco. She reports that she does not currently use alcohol. She reports that she does not use drugs.     Maternal Diabetes: No Genetic Screening: Normal Maternal Ultrasounds/Referrals: Normal Fetal Ultrasounds or other Referrals:  None Maternal Substance Abuse:  No Significant Maternal Medications:  Meds include: Other: Kepra and Zoloft   Significant Maternal Lab Results: none  Number of Prenatal Visits:greater than 3 verified prenatal visits Maternal Vaccinations:TDap Other Comments:  None  Review of Systems History Dilation: 3 Effacement (%): 50 Station:  (high) Exam by:: LD,CNM Blood pressure (!) 96/55, pulse 76, temperature 97.7 F (36.5 C), temperature source Oral, resp. rate 16. Maternal Exam:  Introitus: Normal vulva.   Physical Exam Constitutional:      Appearance: Normal appearance.   Cardiovascular:     Rate and Rhythm: Normal rate and regular  rhythm.     Pulses: Normal pulses.     Heart sounds: Normal heart sounds.  Pulmonary:     Comments: Gravid,  Genitourinary:    General: Normal vulva.   Musculoskeletal:     Cervical back: Normal range of motion.   Skin:    General: Skin is warm.   Neurological:     General: No focal deficit present.     Mental Status: She is alert.   Psychiatric:        Mood and Affect: Mood normal.       Fetus A Non-Stress Test Interpretation for 08/23/23  Indication: threatened preterm labor   Fetal Heart Rate A Mode: External Baseline Rate (A): 145 bpm Variability: Moderate Accelerations: 10 x 10, 15 x 15 Decelerations: None  Uterine Activity Mode: Toco Contraction Frequency (min): 2-3 Contraction Duration (sec): 70-90 Contraction Quality: Mild Resting  Tone Palpated: Relaxed Resting Time: Adequate      Prenatal labs: ABO, Rh: O/Positive/-- (02/04 0000) Antibody: Negative (05/28 0846) Rubella: Nonimmune (02/04 0000) RPR: Non Reactive (05/28 0846)  HBsAg: Negative (02/04 0000)  HIV: Non Reactive (05/28 0846)  GBS:     Assessment/Plan: [id number] admitted to antepartum for threatened preterm labor  -Betamethasone ordered   -Magnesium  started -Procardia ordered -NICU consult placed  -Breech, will need c-section if laboring   Dr Aquilla Knapp aware of admission and plan.   Berkley Breech [facility] 08/23/2023, 7:14 PM

## 2023-08-23 NOTE — OB Triage Note (Signed)
 Patient arrived with complaints of contraction since this morning that got worse around 330p pt also thinks her water broke in the wheelchair, pants are wet with a strong smell. Pt states baby is moving well,denies any Vag bleeding or any other concerns. Pt states she has only been monitored for low weight gain. Pt state she was at [facility] Monday and was 2 cm. VSS, monitors applied and assessing

## 2023-08-23 NOTE — Consult Note (Signed)
  30 + Weeks  Neonatology Consult  Note:  At the request of the patients CNM Anice Kerbs  I met with Maureen George who is at   31.3 weeks currently with pregnancy complicated by contractions and cervical dilation to 3cm and positive FFN.  EFW 1835 grams per US  08/22/23. Maternal history includes  a history of seizures taking Keppra , Bipolar disorder and anxiety/depression and taking Zoloft . PTSD, mini stroke and hx of first degree heart block is also listed as past medical history. It appears she has been treated with Keflex  several times during this pregnancy for UTIs per Care Everywhere.  Also noted in care everywhere ED visit on 05/11/23 she reported that the FOB was HIV+ on anti-viral medications. Of note her HIV was non-reactive on 08/01/23.  -Maternal plan of care includes betamethasone, magnesium  sulfate and procardia. -Fetus is breech, if progresses will be delivered via CS.   We reviewed initial delivery room management, including CPAP, [place], and low but certainly possible need for intubation for surfactant administration.  We discussed feeding immaturity and need for full po intake with multiple days of good weight gain and no apnea or bradycardia before discharge.  We reviewed increased risk of jaundice, infection, and temperature instability.   Discussed likely length of stay.  Thank you for allowing us  to participate in her care.  Please call with questions. Jedd Schulenburg P. Roz Cornelia NNP-BC Neonatal Nurse Practitioner    The total length of face-to-face or floor / unit time for this encounter was 30 minutes.  Counseling and / or coordination of care was greater than fifty percent of the time.

## 2023-08-23 NOTE — OB Triage Note (Signed)
 Obstetric H&P   Chief Complaint: contractions, sitting in a puddle of water   Prenatal Care Provider: [facility]   History of Present Illness: 28 y.o. [id number] [redacted]w[redacted]d by 10/22/2023, by Ultrasound presenting to L&D for contractions. Contractions started around 3:30 this afternoon, they come every 5 to 15 mins, they are now more intense. While sitting in the wheelchair on her way up form the ED she noticed a large gush of fluid that soaked the wheelchair and her clothes, she has felt a few trickles since then. She last had IC 1 month ago. She did some gardening today, has been sipping on a 42 oz soda while gardening.   Rumor was seen at [facility] on 6/16 for abdominal pain, she reports she was told she was 2 cm.    Pregravid weight 54.9 kg Total Weight Gain -2.268 kg  EDD 10/22/23 Problems (from 05/02/23 to present)     Problem Noted Diagnosed Resolved   Encounter for supervision of normal pregnancy, antepartum 05/02/2023 by Myrl Askew, RN  No   Overview Addendum 08/22/2023 11:50 AM by Ozan, Jennifer, DO   NURSING  PROVIDER  Office Location [facility] Dating by U/S at 12 wks  [facility] Model Traditional Anatomy U/S Normal female 'John'  Initiated care at  12wks tx care to FT 15wks from [facility]                Language  English              LAB RESULTS   Support Person  Genetics NIPS: LR boy AFP:     NT/IT (FT only)     Carrier Screen Horizon:   Rhogam  O/Positive/-- (02/04 0000) A1C/GTT Early HgbA1C:  Third trimester 2 hr GTT: 81/64/77  Flu Vaccine     TDaP Vaccine  6/18 Blood Type O/Positive/-- (02/04 0000)  RSV Vaccine  Antibody Negative (02/04 0000)  COVID Vaccine  Rubella Immune (02/04 0000)  Feeding Plan bottle RPR Nonreactive (02/04 0000)  Contraception Not depo/nexp HBsAg Negative (02/04 0000)  Circumcision yes HIV Non-reactive (02/04 0000)  Pediatrician  [facility] HCVAb Negative (02/04 0000)  Prenatal Classes discussed    BTL Consent  Pap 04/10/23 neg  BTL Pre-payment   GC/CT Initial:  -/- 36wks:    VBAC Consent N/a GBS   For PCN allergy, check sensitivities   BRx Optimized? [ ]  yes   [ ]  no    DME Rx [ x] BP cuff [ ]  Weight Scale Waterbirth  [ ]  Class [ ]  Consent [ ]  CNM visit  PHQ9 & GAD7 [ x ] new OB [  ] 28 weeks  [  ] 36 weeks Induction  [ ]  Orders Entered [ ] Foley Y/N              Review of Systems: 10 point review of systems negative unless otherwise noted in HPI  Past Medical History: Patient Active Problem List   Diagnosis Date Noted   Uterine contractions 08/23/2023   Depression with anxiety 06/05/2023    On zoloft  150mg  ~58yr ago   06/05/23 zoloft  25mg /IBH    History of first degree heart block 06/05/2023    Per her report, dx in [place]  Feb 2024   OB cards referral 2/27  06/05/23 hasn't responded to messages trying to schedule, gave her numbers to call them    Encounter for supervision of normal pregnancy, antepartum 05/02/2023     NURSING  PROVIDER  [facility] Dating by U/S at 12 wks  [facility] Model Traditional Anatomy U/S Normal female 'John'  Initiated care at  12wks tx care to FT 15wks from [facility]                Language  English              LAB RESULTS   Support Person  Genetics NIPS: LR boy AFP:     NT/IT (FT only)     Carrier Screen Horizon:   Rhogam  O/Positive/-- (02/04 0000) A1C/GTT Early HgbA1C:  Third trimester 2 hr GTT: 81/64/77  Flu Vaccine     TDaP Vaccine  6/18 Blood Type O/Positive/-- (02/04 0000)  RSV Vaccine  Antibody Negative (02/04 0000)  COVID Vaccine  Rubella Immune (02/04 0000)  Feeding Plan bottle RPR Nonreactive (02/04 0000)  Contraception Not depo/nexp HBsAg Negative (02/04 0000)  Circumcision yes HIV Non-reactive (02/04 0000)  Pediatrician  [facility] HCVAb Negative (02/04 0000)  Prenatal Classes discussed    BTL Consent  Pap 04/10/23 neg  BTL Pre-payment  GC/CT Initial:  -/- 36wks:    VBAC Consent N/a GBS   For PCN allergy, check sensitivities   BRx Optimized? [ ]  yes    [ ]  no    DME Rx [ x] BP cuff [ ]  Weight Scale Waterbirth  [ ]  Class [ ]  Consent [ ]  CNM visit  PHQ9 & GAD7 [ x ] new OB [  ] 28 weeks  [  ] 36 weeks Induction  [ ]  Orders Entered [ ] Foley Y/N       Suicidal ideation 12/21/2022   ADHD (attention deficit hyperactivity disorder) 05/22/2022   Recurrent seizures (HCC) 05/22/2022    Last seizure 12/24   on keppra , sees neuro 5/19    Asthma 05/10/2018    Formatting of this note might be different from the original. Albuterol  2x per week for exacerbation with activity. Given frequency of symptoms in pregnancy >> plan to initiate fluticasone daily [ ]  peak flows next visit Last Assessment & Plan: Formatting of this note might be different from the original. Albuterol  2x per week for exacerbation with activity. Given frequency of symptoms in pregnancy >> plan to initiate fluticasone daily     Past Surgical History: Past Surgical History:  Procedure Laterality Date   right wrist surgery     WISDOM TOOTH EXTRACTION      Past Obstetric History: # 1 - Date: 04/10/14, Sex: Female, Weight: 2948 g, GA: [redacted]w[redacted]d, Type: Vaginal, Spontaneous, Apgar1: None, Apgar5: None, Living: Living, Birth Comments: None  # 2 - Date: 10/16/18, Sex: Female, Weight: 2945 g, GA: [redacted]w[redacted]d, Type: Vaginal, Spontaneous, Apgar1: None, Apgar5: None, Living: Living, Birth Comments: None  # 3 - Date: None, Sex: None, Weight: None, GA: None, Type: None, Apgar1: None, Apgar5: None, Living: None, Birth Comments: None   Past Gynecologic History:  Family History: Family History  Problem Relation Age of Onset   Diabetes Mother    Diabetes Maternal Grandmother    Breast cancer Maternal Grandmother    Cancer Paternal Grandmother    Colon cancer Paternal Grandmother     Social History: Social History   Socioeconomic History   Marital status: Married    Spouse name: Not on file   Number of children: Not on file   Years of education: Not on file   Highest education level: Not  on file  Occupational History   Not on file  Tobacco Use  Smoking status: Never   Smokeless tobacco: Never  Vaping Use   Vaping status: Former  Substance and Sexual Activity   Alcohol use: Not Currently   Drug use: No   Sexual activity: Yes    Birth control/protection: None  Other Topics Concern   Not on file  Social History Narrative   Not on file   Social Drivers of Health   Financial Resource Strain: Low Risk  (07/06/2023)   Received from [facility]   Overall Financial Resource Strain (CARDIA)    Difficulty of Paying Living Expenses: Not hard at all  Food Insecurity: No Food Insecurity (07/06/2023)   Received from [facility]   Hunger Vital Sign    Within the past 12 months, you worried that your food would run out before you got the money to buy more.: Never true    Within the past 12 months, the food you bought just didn't last and you didn't have money to get more.: Never true  Transportation Needs: No Transportation Needs (07/06/2023)   Received from [facility] - Transportation    Lack of Transportation (Medical): No    Lack of Transportation (Non-Medical): No  Physical Activity: Sufficiently Active (05/03/2023)   Exercise Vital Sign    Days of Exercise per Week: 7 days    Minutes of Exercise per Session: 100 min  Stress: No Stress Concern Present (05/03/2023)   [employer] of Occupational Health - Occupational Stress Questionnaire    Feeling of Stress : Not at all  Social Connections: Socially Isolated (05/03/2023)   Social Connection and Isolation Panel    Frequency of Communication with Friends and Family: More than three times a week    Frequency of Social Gatherings with Friends and Family: More than three times a week    Attends Religious Services: Never    Database administrator or Organizations: No    Attends Banker Meetings: Never    Marital Status: Separated  Intimate Partner Violence: Not At Risk (07/06/2023)    Received from [facility]   Humiliation, Afraid, Rape, and Kick questionnaire    Within the last year, have you been afraid of your partner or ex-partner?: No    Within the last year, have you been humiliated or emotionally abused in other ways by your partner or ex-partner?: No    Within the last year, have you been kicked, hit, slapped, or otherwise physically hurt by your partner or ex-partner?: No    Within the last year, have you been raped or forced to have any kind of sexual activity by your partner or ex-partner?: No    Medications: Prior to Admission medications   Medication Sig Start Date End Date Taking? Authorizing Provider  albuterol  (VENTOLIN  HFA) 108 (90 Base) MCG/ACT inhaler Inhale 2 puffs into the lungs every 6 (six) hours as needed for wheezing or shortness of breath. 12/21/22  Yes Buena Carmine, NP  Blood Pressure Monitor MISC For regular home bp monitoring during pregnancy 05/03/23  Yes Cresenzo-Dishmon, Blanca Bunch, CNM  levETIRAcetam  (KEPPRA ) 500 MG tablet Take 1 tablet (500 mg total) by mouth 2 (two) times daily. 08/01/23  Yes Hermann Look D, CNM  ondansetron  (ZOFRAN -ODT) 4 MG disintegrating tablet Take 1 tablet (4 mg total) by mouth every 6 (six) hours as needed for nausea. 06/05/23  Yes Ferd Householder, CNM  sertraline  (ZOLOFT ) 25 MG tablet Take 1 tablet (25 mg total) by mouth daily. 06/05/23  Yes Ferd Householder, CNM    Allergies: Allergies  Allergen Reactions   Lamotrigine Hives and Rash   Latex Rash   Naproxen Rash and Hives    Physical Exam: Vitals: Blood pressure (!) 96/55, pulse 76, temperature 97.7 F (36.5 C), temperature source Oral, resp. rate 16.   FHT: baseline 140, moderate variability, pos accel neg decel  Toco: rare, irritability noted   General: NAD HEENT: normocephalic, anicteric Pulmonary: No increased work of breathing Cardiovascular: RRR, distal pulses 2+ Abdomen: Gravid, non-tender Leopolds: Genitourinary: small yellow area  noted on chux pad under pt's buttocks.  SSE: cervix parous, mucus like discharge present, negative pooling.   Extremities: no edema, erythema, or tenderness Neurologic: Grossly intact Psychiatric: mood appropriate, affect full  Labs: No results found for this or any previous visit (from the past 24 hours).  Assessment: 28 y.o. [id number] [redacted]w[redacted]d by 10/22/2023, by Ultrasound here for contractions and rule out rupture.   Plan: 1)FFN and ROM plus collected, IV fluid bolus ordered   2) Fetus -category I tracing   3) PNL - Blood type O/Positive/-- (02/04 0000) / Anti-bodyscreen Negative (05/28 0846) / Rubella Nonimmune (02/04 0000) / Varicella UNK / RPR Non Reactive (05/28 0846) / HBsAg Negative (02/04 0000) / HIV Non Reactive (05/28 0846) /fasting 81, 1 hr 64, 2 hr 77/ GBS    4) Immunization History -  Immunization History  Administered Date(s) Administered   Tdap 08/22/2023      Anice Kerbs CNM  [facility] 08/23/2023, 4:57 PM

## 2023-08-24 MED ORDER — CYCLOBENZAPRINE HCL 5 MG PO TABS: 5.0000 mg | ORAL_TABLET | Freq: Three times a day (TID) | ORAL | Status: DC

## 2023-08-24 MED ORDER — CALCIUM CARBONATE ANTACID 500 MG PO CHEW
400.0000 mg | CHEWABLE_TABLET | ORAL | Status: DC | PRN
  Administered 2023-08-24: 400 mg via ORAL

## 2023-08-24 MED ORDER — ONDANSETRON HCL 4 MG/2ML IJ SOLN
4.0000 mg | Freq: Four times a day (QID) | INTRAMUSCULAR | Status: DC
  Administered 2023-08-24: 4 mg via INTRAVENOUS
  Filled 2023-08-24: qty 2

## 2023-08-24 MED ORDER — CALCIUM CARBONATE ANTACID 500 MG PO CHEW
CHEWABLE_TABLET | ORAL | Status: AC
  Filled 2023-08-24: qty 2

## 2023-08-24 MED ORDER — ONDANSETRON HCL 4 MG/2ML IJ SOLN
4.0000 mg | Freq: Four times a day (QID) | INTRAMUSCULAR | Status: DC | PRN
  Administered 2023-08-24: 4 mg via INTRAVENOUS
  Filled 2023-08-24: qty 2

## 2023-08-24 MED ORDER — LEVETIRACETAM 500 MG PO TABS
500.0000 mg | ORAL_TABLET | Freq: Two times a day (BID) | ORAL | Status: DC
  Administered 2023-08-24 – 2023-08-25 (×3): 500 mg via ORAL
  Filled 2023-08-24 (×3): qty 1

## 2023-08-24 NOTE — Progress Notes (Signed)
 [facility] ANTEPARTUM COMPREHENSIVE PROGRESS NOTE  Maureen George is a 28 y.o. [id number] at 109w4d who is admitted for threatened preterm labor.  Estimated Date of Delivery: 10/22/23 Fetal presentation is breech.  Length of Stay:  1 Days. Admitted 08/23/2023  Subjective:  Patient reports good fetal movement.  She reports occasional uterine contractions, no bleeding and no loss of fluid per vagina. She is rating her contractions 8/10 when they happen.   Vitals:  Blood pressure (!) 80/45, pulse 61, temperature 97.8 F (36.6 C), temperature source Oral, resp. rate 14, height 5' 6 (1.676 m), weight 52.2 kg, SpO2 100%. Physical Examination: CONSTITUTIONAL: Well-developed, well-nourished female in no acute distress.  HENT:  Normocephalic, atraumatic, External right and left ear normal. Oropharynx is clear and moist EYES: Conjunctivae and EOM are normal. Pupils are equal, round, and reactive to light. No scleral icterus.  NECK: Normal range of motion, supple, no masses SKIN: Skin is warm and dry. No rash noted. Not diaphoretic. No erythema. No pallor. NEUROLGIC: Alert and oriented to person, place, and time. Normal reflexes, muscle tone coordination. No cranial nerve deficit noted. PSYCHIATRIC: Normal mood and affect. Normal behavior. Normal judgment and thought content. CARDIOVASCULAR: Normal heart rate noted, regular rhythm RESPIRATORY: Effort and breath sounds normal, no problems with respiration noted MUSCULOSKELETAL: Normal range of motion. No edema and no tenderness. 2+ distal pulses. ABDOMEN: Soft, nontender, nondistended, gravid. CERVIX: Dilation: 3 Effacement (%): 50 Cervical Position: [place]:  (high) Presentation:  (breech) Exam by:: Sherline Distel Chellsea Beckers, CNM  Fetal monitoring: FHR: 145 bpm, Variability: moderate, Accelerations: Present, Decelerations: Absent  Uterine activity: 6 contractions per hour  Results for orders placed or performed during the hospital encounter  of 08/23/23 (from the past 48 hours)  Rupture of Membrane (ROM) Plus     Status: None   Collection Time: 08/23/23  4:54 PM  Result Value Ref Range   Rom Plus NEGATIVE     Comment: Performed at [facility], [address] [place], [place] [address]  Fetal fibronectin     Status: Abnormal   Collection Time: 08/23/23  4:54 PM  Result Value Ref Range   Fetal Fibronectin POSITIVE (A) NEGATIVE    Comment: Performed at [facility], [address] [place], [place] [address]  Group B strep by PCR     Status: None   Collection Time: 08/23/23  7:03 PM   Specimen: Vaginal/Rectal; Genital  Result Value Ref Range   Group B strep by PCR PRESUMPTIVE NEGATIVE PRESUMPTIVE NEGATIVE    Comment: (NOTE) Intrapartum testing with Xpert Xpress GBS assay should be used as an adjunct to other methods available and not used to replace antepartum testing (at 35-[redacted] weeks gestation).  A GBS PRESUMPTIVE NEGATIVE should be interpreted as: Patient may or may not be colonized with GBS. A GBS presumptive negative result does not exclude the possibility of GBS colonization. Providers should consider new risk factors, if applicable, and clinical guidance regarding a role for intrapartum prophylaxis.  Recommend culture for confirmation of GBS colonization is clinically indicated.  Performed at [facility], [address] [place], [place] [address]   Urinalysis, Complete w Microscopic -Urine, Catheterized     Status: Abnormal   Collection Time: 08/23/23  9:45 PM  Result Value Ref Range   Color, Urine STRAW (A) YELLOW   APPearance CLEAR (A) CLEAR   Specific Gravity, Urine 1.006 1.005 - 1.030   pH 7.0 5.0 - 8.0   Glucose, UA NEGATIVE NEGATIVE mg/dL  Hgb urine dipstick NEGATIVE NEGATIVE   Bilirubin Urine NEGATIVE NEGATIVE   Ketones, ur 20 (A) NEGATIVE mg/dL   Protein, ur NEGATIVE NEGATIVE mg/dL   Nitrite NEGATIVE NEGATIVE   Leukocytes,Ua NEGATIVE NEGATIVE   RBC / HPF 0-5 0 -  5 RBC/hpf   WBC, UA 0-5 0 - 5 WBC/hpf   Bacteria, UA RARE (A) NONE SEEN   Squamous Epithelial / HPF 0-5 0 - 5 /HPF   Mucus PRESENT     Comment: Performed at [facility], [address], [place], [place] [address]  Urine Drug Screen, Qualitative ([facility] only)     Status: None   Collection Time: 08/23/23  9:45 PM  Result Value Ref Range   Tricyclic, Ur Screen NONE DETECTED NONE DETECTED   Amphetamines, Ur Screen NONE DETECTED NONE DETECTED   MDMA (Ecstasy)Ur Screen NONE DETECTED NONE DETECTED   Cocaine Metabolite,Ur [place] NONE DETECTED NONE DETECTED   Opiate, Ur Screen NONE DETECTED NONE DETECTED   Phencyclidine (PCP) Ur S NONE DETECTED NONE DETECTED   Cannabinoid 50 Ng, Ur  NONE DETECTED NONE DETECTED   Barbiturates, Ur Screen NONE DETECTED NONE DETECTED   Benzodiazepine, Ur Scrn NONE DETECTED NONE DETECTED   Methadone Scn, Ur NONE DETECTED NONE DETECTED    Comment: (NOTE) Tricyclics + metabolites, urine    Cutoff 1000 ng/mL Amphetamines + metabolites, urine  Cutoff 1000 ng/mL MDMA (Ecstasy), urine              Cutoff 500 ng/mL Cocaine Metabolite, urine          Cutoff 300 ng/mL Opiate + metabolites, urine        Cutoff 300 ng/mL Phencyclidine (PCP), urine         Cutoff 25 ng/mL Cannabinoid, urine                 Cutoff 50 ng/mL Barbiturates + metabolites, urine  Cutoff 200 ng/mL Benzodiazepine, urine              Cutoff 200 ng/mL Methadone, urine                   Cutoff 300 ng/mL  The urine drug screen provides only a preliminary, unconfirmed analytical test result and should not be used for non-medical purposes. Clinical consideration and professional judgment should be applied to any positive drug screen result due to possible interfering substances. A more specific alternate chemical method must be used in order to obtain a confirmed analytical result. Gas chromatography / mass spectrometry (GC/MS) is the preferred confirm atory method. Performed at [facility], [address] [place], [place] [address]     No results found.  Current scheduled medications  cyclobenzaprine  5 mg Oral TID   levETIRAcetam   500 mg Oral BID   NIFEdipine  30 mg Oral BID   prenatal multivitamin  1 tablet Oral Q1200   sertraline   25 mg Oral Daily   sodium chloride  flush  3-10 mL Intravenous Q12H    I have reviewed the patient's current medications.  ASSESSMENT: Principal Problem:   Uterine contractions Active Problems:   Threatened preterm labor   PLAN: Patient is now steroid completed and has had 24 hours of magnesium . She did complain of increased pain with contractions this evening. Her cervical exam remains unchanged. Will continue to monitor this evening and reassess discharge tomorrow if she remains stable.   Baby remains breech and patient knows she will need a c/s if she starts to labor  with cervical change.   Dr. Cleora Daft updated on patient condition and plan.   Donato Fu, CNM [facility] at [facility]

## 2023-08-25 LAB — URINE CULTURE: Culture: NO GROWTH

## 2023-08-25 MED ORDER — POLYETHYLENE GLYCOL 3350 17 G PO PACK
17.0000 g | PACK | Freq: Every day | ORAL | Status: DC
  Administered 2023-08-25: 17 g via ORAL
  Filled 2023-08-25: qty 1

## 2023-08-25 NOTE — Discharge Summary (Signed)
 Patient ID: Maureen George MRN: [id number] DOB/AGE: Nov 05, 1995 28 y.o.  Admit date: 08/23/2023 Discharge date: 08/25/2023  Admission Diagnoses: Threatened Preterm Labor  Discharge Diagnoses: Threatened Preterm Labor, currently stable.   Prenatal Care Site: [facility]  Consults: Neonatology, Maternal Fetal Medicine  Significant Diagnostic Studies:  Results for orders placed or performed during the hospital encounter of 08/23/23 (from the past week)  Rupture of Membrane (ROM) Plus   Collection Time: 08/23/23  4:54 PM  Result Value Ref Range   Rom Plus NEGATIVE   Fetal fibronectin   Collection Time: 08/23/23  4:54 PM  Result Value Ref Range   Fetal Fibronectin POSITIVE (A) NEGATIVE  Group B strep by PCR   Collection Time: 08/23/23  7:03 PM   Specimen: Vaginal/Rectal; Genital  Result Value Ref Range   Group B strep by PCR PRESUMPTIVE NEGATIVE PRESUMPTIVE NEGATIVE  Urine Culture (for pregnant, neutropenic or urologic patients or patients with an indwelling urinary catheter)   Collection Time: 08/23/23  9:45 PM   Specimen: Urine, Catheterized  Result Value Ref Range   Specimen Description      URINE, CATHETERIZED Performed at [facility], [address] [place], [place] [address]    Special Requests      NONE Performed at [facility], [address] [place], [place] [address]    Culture      NO GROWTH Performed at [facility], [address] [place], [place] [address]    Report Status 08/25/2023 FINAL   Urinalysis, Complete w Microscopic -Urine, Catheterized   Collection Time: 08/23/23  9:45 PM  Result Value Ref Range   Color, Urine STRAW (A) YELLOW   APPearance CLEAR (A) CLEAR   Specific Gravity, Urine 1.006 1.005 - 1.030   pH 7.0 5.0 - 8.0   Glucose, UA NEGATIVE NEGATIVE mg/dL   Hgb urine dipstick NEGATIVE NEGATIVE   Bilirubin Urine NEGATIVE NEGATIVE   Ketones, ur 20 (A) NEGATIVE mg/dL   Protein, ur NEGATIVE NEGATIVE mg/dL    Nitrite NEGATIVE NEGATIVE   Leukocytes,Ua NEGATIVE NEGATIVE   RBC / HPF 0-5 0 - 5 RBC/hpf   WBC, UA 0-5 0 - 5 WBC/hpf   Bacteria, UA RARE (A) NONE SEEN   Squamous Epithelial / HPF 0-5 0 - 5 /HPF   Mucus PRESENT   Urine Drug Screen, Qualitative ([facility] only)   Collection Time: 08/23/23  9:45 PM  Result Value Ref Range   Tricyclic, Ur Screen NONE DETECTED NONE DETECTED   Amphetamines, Ur Screen NONE DETECTED NONE DETECTED   MDMA (Ecstasy)Ur Screen NONE DETECTED NONE DETECTED   Cocaine Metabolite,Ur [place] NONE DETECTED NONE DETECTED   Opiate, Ur Screen NONE DETECTED NONE DETECTED   Phencyclidine (PCP) Ur S NONE DETECTED NONE DETECTED   Cannabinoid 50 Ng, Ur [place] NONE DETECTED NONE DETECTED   Barbiturates, Ur Screen NONE DETECTED NONE DETECTED   Benzodiazepine, Ur Scrn NONE DETECTED NONE DETECTED   Methadone Scn, Ur NONE DETECTED NONE DETECTED     Hospital Course:  This is a 28 y.o. [id number] with IUP at [redacted]w[redacted]d admitted for threatened preterm labor, noted to have a cervical exam of 3/50.  No leaking of fluid and no bleeding.  She was initially started on magnesium  sulfate for tocolysis and neuroprotection and also received betamethasone  x 2 doses.  Her tocolysis was transitioned to Procardia  which was discontinued on the evening of 6/20 due to low blood pressures. She was seen by Neonatology during her stay.  She  was observed, fetal heart rate monitoring remained reassuring, and she had no signs/symptoms of progressing preterm labor or other maternal-fetal concerns.  Her cervical exam was unchanged from admission.  She was deemed stable for discharge to home with outpatient follow up.  Discharge Physical Exam:  BP (!) 78/38   Pulse (!) 52   Temp 98 F (36.7 C) (Oral)   Resp 16   Ht 5' 6 (1.676 m)   Wt 52.2 kg   SpO2 100%   BMI 18.56 kg/m   General: NAD CV: RRR Pulm: normal work of breathing ABD: s/nd/nt, gravid DVT Evaluation: LE non-ttp, no evidence of DVT on exam.  SVE: Dilation:  3 Effacement (%): 50 Cervical Position: Middle Station:  (high) Presentation:  (breech) Exam by:: L Dominic, CNM  NST Baseline: 130 bpm Variability: Moderate Accelerations: greater than two 15xa5bpm Decelerations: absent TOCO: no contractions, mild uterine irritability.   Discharge Condition: Stable  Disposition: Discharge disposition: 01-Home or Self Care       Discharge Instructions     Notify physician for a general feeling that something is not right   Complete by: As directed    Notify physician for increase or change in vaginal discharge   Complete by: As directed    Notify physician for intestinal cramps, with or without diarrhea, sometimes described as gas pain   Complete by: As directed    Notify physician for leaking of fluid   Complete by: As directed    Notify physician for low, dull backache, unrelieved by heat or Tylenol    Complete by: As directed    Notify physician for menstrual like cramps   Complete by: As directed    Notify physician for pelvic pressure   Complete by: As directed    Notify physician for uterine contractions.  These may be painless and feel like the uterus is tightening or the baby is  balling up   Complete by: As directed    Notify physician for vaginal bleeding   Complete by: As directed    PRETERM LABOR:  Includes any of the follwing symptoms that occur between 20 - [redacted] weeks gestation.  If these symptoms are not stopped, preterm labor can result in preterm delivery, placing your baby at risk   Complete by: As directed       Allergies as of 08/25/2023       Reactions   Lamotrigine Hives, Rash   Latex Rash   Naproxen Rash, Hives        Medication List     TAKE these medications    albuterol  108 (90 Base) MCG/ACT inhaler Commonly known as: VENTOLIN  HFA Inhale 2 puffs into the lungs every 6 (six) hours as needed for wheezing or shortness of breath.   Blood Pressure Monitor Misc For regular home bp monitoring  during pregnancy   levETIRAcetam  500 MG tablet Commonly known as: KEPPRA  Take 1 tablet (500 mg total) by mouth 2 (two) times daily.   ondansetron  4 MG disintegrating tablet Commonly known as: ZOFRAN -ODT Take 1 tablet (4 mg total) by mouth every 6 (six) hours as needed for nausea.   sertraline  25 MG tablet Commonly known as: Zoloft  Take 1 tablet (25 mg total) by mouth daily.         Signed:  Lauraine PARAS Revel Stellmach 08/25/2023 7:13 PM ----- Therisa Pillow, CNM Certified Nurse Midwife [facility]

## 2023-09-10 ENCOUNTER — Ambulatory Visit: Payer: MEDICAID | Admitting: Women's Health

## 2023-09-10 ENCOUNTER — Encounter: Payer: Self-pay | Admitting: Women's Health

## 2023-09-10 VITALS — BP 106/69 | HR 80 | Wt 115.0 lb

## 2023-09-10 DIAGNOSIS — O26843 Uterine size-date discrepancy, third trimester: Secondary | ICD-10-CM

## 2023-09-10 DIAGNOSIS — Z3A34 34 weeks gestation of pregnancy: Secondary | ICD-10-CM

## 2023-09-10 DIAGNOSIS — Z3483 Encounter for supervision of other normal pregnancy, third trimester: Secondary | ICD-10-CM

## 2023-09-10 NOTE — Progress Notes (Signed)
 LOW-RISK PREGNANCY VISIT Patient name: Maureen George MRN [id number]  Date of birth: 1995-12-22 Chief Complaint:   Routine Prenatal Visit  History of Present Illness:   Maureen George is a 28 y.o. G33P2002 female at [redacted]w[redacted]d with an Estimated Date of Delivery: 10/22/23 being seen today for ongoing management of a low-risk pregnancy.   Today she reports pressure-wants to be checked. Admitted at [facility] 6/19-6/21 w/ PTL, was 3/50/high, given  mag, BMZ x 2. Heartburn, hasn't tried anything. Contractions: Irritability. Vag. Bleeding: None.  Movement: Present. denies leaking of fluid.     08/01/2023    9:05 AM 07/18/2023    8:26 AM 05/03/2023    3:14 PM  Depression screen PHQ 2/9  Decreased Interest 0 0 0  Down, Depressed, Hopeless 0 1 0  PHQ - 2 Score 0 1 0  Altered sleeping 0 0 0  Tired, decreased energy 1 0 0  Change in appetite 0 0 1  Feeling bad or failure about yourself  0 0 0  Trouble concentrating 0 0 0  Moving slowly or fidgety/restless 0 0 0  Suicidal thoughts 0 0 0  PHQ-9 Score 1 1 1         07/18/2023    8:28 AM 05/03/2023    3:14 PM  GAD 7 : Generalized Anxiety Score  Nervous, Anxious, on Edge 0 0  Control/stop worrying 0 0  Worry too much - different things 0 0  Trouble relaxing 0 0  Restless 0 0  Easily annoyed or irritable 0 0  Afraid - awful might happen 0 0  Total GAD 7 Score 0 0      Review of Systems:   Pertinent items are noted in HPI Denies abnormal vaginal discharge w/ itching/odor/irritation, headaches, visual changes, shortness of breath, chest pain, abdominal pain, severe nausea/vomiting, or problems with urination or bowel movements unless otherwise stated above. Pertinent History Reviewed:  Reviewed past medical,surgical, social, obstetrical and family history.  Reviewed problem list, medications and allergies. Physical Assessment:   Vitals:   09/10/23 1416  BP: 106/69  Pulse: 80  Weight: 115 lb (52.2 kg)  Body mass index is 18.56 kg/m.         Physical Examination:   General appearance: Well appearing, and in no distress  Mental status: Alert, oriented to person, place, and time  Skin: Warm & dry  Cardiovascular: Normal heart rate noted  Respiratory: Normal respiratory effort, no distress  Abdomen: Soft, gravid, nontender  Pelvic: Cervical exam performed  Dilation: 3 Effacement (%): 50 Station: -3  Extremities: Edema: None  Fetal Status: Fetal Heart Rate (bpm): 146 Fundal Height: 24 cm Movement: Present Presentation: Vertex  Chaperone: Latisha Cresenzo No results found for this or any previous visit (from the past 24 hours).  Assessment & Plan:  1) Low-risk pregnancy G3P2002 at [redacted]w[redacted]d with an Estimated Date of Delivery: 10/22/23   2) Recent PTL admit, s/p mag/BMZ x 2, cx same today, reviewed ptl s/s, reasons to seek care  3) Seizure disorder> on Keppra   4) H/O 1st degree heart block> didn't respond to call from [facility] cards for appt, had normal EKG Oct 2024  5) Heartburn> try TUMS, if not helping let us  know  6) Size<dates-24cm today, EFW 54% w/ normal AFI at 31wk, will repeat next visit   Meds: No orders of the defined types were placed in this encounter.  Labs/procedures today: SVE  Plan:  Continue routine obstetrical care  Next visit: prefers will be in  person for cultures    Reviewed: Preterm labor symptoms and general obstetric precautions including but not limited to vaginal bleeding, contractions, leaking of fluid and fetal movement were reviewed in detail with the patient.  All questions were answered. Does have home bp cuff. Office bp cuff given: not applicable. Check bp weekly, let us  know if consistently >140 and/or >90.  Follow-up: Return in about 2 weeks (around 09/24/2023) for LROB, US :EFW, CNM, in person.  Future Appointments  Date Time Provider Department Center  10/01/2023  8:30 AM [facility] US  [facility] None  10/01/2023  9:30 AM Ozan, Jennifer, DO [facility]    Orders Placed This Encounter   Procedures   US  OB Follow Up   Suzen JONELLE Fetters CNM, [facility] 09/10/2023 2:34 PM

## 2023-09-10 NOTE — Progress Notes (Signed)
 Maureen George

## 2023-09-10 NOTE — Patient Instructions (Signed)
 Maureen George, thank you for choosing our office today! We appreciate the opportunity to meet your healthcare needs. You may receive a short survey by mail, e-mail, or through [employer]. If you are happy with your care we would appreciate if you could take just a few minutes to complete the survey questions. We read all of your comments and take your feedback very seriously. Thank you again for choosing our office.  Center for [employer] Team at [facility]  [facility] ([address] [place], [place] [address]) Entrance C, located off of [address] Free 24/7 valet parking   CLASSES: Go to [email or web address] to register for classes (childbirth, breastfeeding, waterbirth, infant CPR, daddy bootcamp, etc.)  Call the office [phone]) or go to [facility] if: You begin to have strong, frequent contractions Your water breaks.  Sometimes it is a big gush of fluid, sometimes it is just a trickle that keeps getting your panties wet or running down your legs You have vaginal bleeding.  It is normal to have a small amount of spotting if your cervix was checked.  You don't feel your baby moving like normal.  If you don't, get you something to eat and drink and lay down and focus on feeling your baby move.   If your baby is still not moving like normal, you should call the office or go to [facility].  Call the office [phone]) or go to [facility] hospital for these signs of pre-eclampsia: Severe headache that does not go away with Tylenol  Visual changes- seeing spots, double, blurred vision Pain under your right breast or upper abdomen that does not go away with Tums or heartburn medicine Nausea and/or vomiting Severe swelling in your hands, feet, and face   Tdap Vaccine It is recommended that you get the Tdap vaccine during the third trimester of EACH pregnancy to help protect your baby from getting pertussis (whooping cough) 27-36 weeks is the BEST time to do  this so that you can pass the protection on to your baby. During pregnancy is better than after pregnancy, but if you are unable to get it during pregnancy it will be offered at the hospital.  You can get this vaccine with us , at the health department, your family doctor, or some local pharmacies Everyone who will be around your baby should also be up-to-date on their vaccines before the baby comes. Adults (who are not pregnant) only need 1 dose of Tdap during adulthood.   [facility] Pediatricians/Family Doctors [facility]): [address] [place]uba [place], [phone]           [facility]: [address], [phone]                [facility]): [address], [phone] (call to ask if accepting patients) [facility]: [address], [place], [phone]    [facility] Pediatricians/Family Doctors [facility]): [phone] [address], [phone] [facility]: [address] [place], [phone] [facility]: [address], [phone]  [facility] Doctors  [facility]): [phone] [facility]: [address] [phone]   [facility] Doctors [facility]: [address] [phone]  [facility] Family Doctors  [facility]: [phone], [phone]  Home Blood Pressure Monitoring for Patients   Your provider has recommended that you check your  blood pressure (BP) at least once a week at home. If you do not have a blood pressure cuff at home, one will be provided for you. Contact your provider if you have not received your monitor within 1 week.   Helpful Tips for Accurate Home Blood Pressure Checks  Don't smoke, exercise, or drink caffeine 30 minutes before checking your BP Use the restroom before checking your BP (a full bladder can raise your  pressure) Relax in a comfortable upright chair Feet on the ground Left arm resting comfortably on a flat surface at the level of your heart Legs uncrossed Back supported Sit quietly and don't talk Place the cuff on your bare arm Adjust snuggly, so that only two fingertips can fit between your skin and the top of the cuff Check 2 readings separated by at least one minute Keep a log of your BP readings For a visual, please reference this diagram: http://ccnc.care/bpdiagram  Provider Name: [facility]     Phone: [phone]  Zone 1: ALL CLEAR  Continue to monitor your symptoms:  BP reading is less than 140 (top number) or less than 90 (bottom number)  No right upper stomach pain No headaches or seeing spots No feeling nauseated or throwing up No swelling in face and hands  Zone 2: CAUTION Call your doctor's office for any of the following:  BP reading is greater than 140 (top number) or greater than 90 (bottom number)  Stomach pain under your ribs in the middle or right side Headaches or seeing spots Feeling nauseated or throwing up Swelling in face and hands  Zone 3: EMERGENCY  Seek immediate medical care if you have any of the following:  BP reading is greater than160 (top number) or greater than 110 (bottom number) Severe headaches not improving with Tylenol  Serious difficulty catching your breath Any worsening symptoms from Zone 2  Preterm Labor and Birth Information  The normal length of a pregnancy is 39-41 weeks. Preterm labor is when labor starts before 37 completed weeks of pregnancy. What are the risk factors for preterm labor? Preterm labor is more likely to occur in women who: Have certain infections during pregnancy such as a bladder infection, sexually transmitted infection, or infection inside the uterus (chorioamnionitis). Have a shorter-than-normal cervix. Have gone into preterm labor before. Have had surgery on their cervix. Are younger than age 23  or older than age 4. Are African American. Are pregnant with twins or multiple babies (multiple gestation). Take street drugs or smoke while pregnant. Do not gain enough weight while pregnant. Became pregnant shortly after having been pregnant. What are the symptoms of preterm labor? Symptoms of preterm labor include: Cramps similar to those that can happen during a menstrual period. The cramps may happen with diarrhea. Pain in the abdomen or lower back. Regular uterine contractions that may feel like tightening of the abdomen. A feeling of increased pressure in the pelvis. Increased watery or bloody mucus discharge from the vagina. Water breaking (ruptured amniotic sac). Why is it important to recognize signs of preterm labor? It is important to recognize signs of preterm labor because babies who are born prematurely may not be fully developed. This can put them at an increased risk for: Long-term (chronic) heart and lung problems. Difficulty immediately after birth with regulating body systems, including blood sugar, body temperature, heart rate, and breathing rate. Bleeding in the brain. Cerebral palsy. Learning difficulties. Death. These risks are highest for babies who are born before 34 weeks  of pregnancy. How is preterm labor treated? Treatment depends on the length of your pregnancy, your condition, and the health of your baby. It may involve: Having a stitch (suture) placed in your cervix to prevent your cervix from opening too early (cerclage). Taking or being given medicines, such as: Hormone medicines. These may be given early in pregnancy to help support the pregnancy. Medicine to stop contractions. Medicines to help mature the baby's lungs. These may be prescribed if the risk of delivery is high. Medicines to prevent your baby from developing cerebral palsy. If the labor happens before 34 weeks of pregnancy, you may need to stay in the hospital. What should I do if I  think I am in preterm labor? If you think that you are going into preterm labor, call your health care provider right away. How can I prevent preterm labor in future pregnancies? To increase your chance of having a full-term pregnancy: Do not use any tobacco products, such as cigarettes, chewing tobacco, and e-cigarettes. If you need help quitting, ask your health care provider. Do not use street drugs or medicines that have not been prescribed to you during your pregnancy. Talk with your health care provider before taking any herbal supplements, even if you have been taking them regularly. Make sure you gain a healthy amount of weight during your pregnancy. Watch for infection. If you think that you might have an infection, get it checked right away. Make sure to tell your health care provider if you have gone into preterm labor before. This information is not intended to replace advice given to you by your health care provider. Make sure you discuss any questions you have with your health care provider. Document Revised: 06/14/2018 Document Reviewed: 07/14/2015 Elsevier Patient Education  2020 [employer].

## 2023-09-16 ENCOUNTER — Encounter: Payer: Self-pay | Admitting: Obstetrics & Gynecology

## 2023-09-18 ENCOUNTER — Other Ambulatory Visit: Payer: Self-pay

## 2023-09-18 ENCOUNTER — Observation Stay
Admission: EM | Admit: 2023-09-18 | Discharge: 2023-09-19 | Disposition: A | Discharge status: Home / Self Care | Payer: MEDICAID | Attending: Certified Nurse Midwife | Admitting: Certified Nurse Midwife

## 2023-09-18 ENCOUNTER — Encounter: Payer: Self-pay | Admitting: Obstetrics and Gynecology

## 2023-09-18 ENCOUNTER — Observation Stay: Payer: MEDICAID

## 2023-09-18 DIAGNOSIS — O320XX0 Maternal care for unstable lie, not applicable or unspecified: Secondary | ICD-10-CM | POA: Diagnosis present

## 2023-09-18 DIAGNOSIS — O4703 False labor before 37 completed weeks of gestation, third trimester: Secondary | ICD-10-CM | POA: Diagnosis present

## 2023-09-18 DIAGNOSIS — Z3480 Encounter for supervision of other normal pregnancy, unspecified trimester: Principal | ICD-10-CM

## 2023-09-18 DIAGNOSIS — O4193X0 Disorder of amniotic fluid and membranes, unspecified, third trimester, not applicable or unspecified: Secondary | ICD-10-CM | POA: Diagnosis not present

## 2023-09-18 DIAGNOSIS — Z3A35 35 weeks gestation of pregnancy: Secondary | ICD-10-CM | POA: Diagnosis not present

## 2023-09-18 DIAGNOSIS — O4700 False labor before 37 completed weeks of gestation, unspecified trimester: Secondary | ICD-10-CM | POA: Diagnosis present

## 2023-09-18 LAB — RUPTURE OF MEMBRANE (ROM)PLUS: Rom Plus: NEGATIVE

## 2023-09-18 MED ORDER — LACTATED RINGERS IV BOLUS
1000.0000 mL | Freq: Once | INTRAVENOUS | Status: AC
  Administered 2023-09-18: 1000 mL via INTRAVENOUS

## 2023-09-18 MED ORDER — TERBUTALINE SULFATE 1 MG/ML IJ SOLN
0.2500 mg | Freq: Once | INTRAMUSCULAR | Status: AC
  Administered 2023-09-18: 0.25 mg via SUBCUTANEOUS
  Filled 2023-09-18: qty 1

## 2023-09-18 MED ORDER — LEVETIRACETAM 500 MG PO TABS
500.0000 mg | ORAL_TABLET | Freq: Two times a day (BID) | ORAL | Status: DC
  Administered 2023-09-18 – 2023-09-19 (×2): 500 mg via ORAL
  Filled 2023-09-18 (×2): qty 1

## 2023-09-18 MED ORDER — SERTRALINE HCL 25 MG PO TABS
25.0000 mg | ORAL_TABLET | Freq: Every day | ORAL | Status: DC
  Administered 2023-09-18 – 2023-09-19 (×2): 25 mg via ORAL
  Filled 2023-09-18 (×2): qty 1

## 2023-09-18 MED ORDER — METRONIDAZOLE 500 MG PO TABS
500.0000 mg | ORAL_TABLET | Freq: Two times a day (BID) | ORAL | Status: DC
  Administered 2023-09-19 (×2): 500 mg via ORAL
  Filled 2023-09-18 (×2): qty 1

## 2023-09-18 NOTE — OB Triage Provider Note (Signed)
 Obstetric H&P   Chief Complaint: contractions   Prenatal Care Provider: [facility]  History of Present Illness: 28 y.o. H6E7997 [redacted]w[redacted]d by 10/22/2023, by Ultrasound presenting to L&D for contractions. Pt reports she been contracting forever her partner states they started yesterday, the contractions have been every 5 minutes and are getting more intense. Yesterday she felt a gush of fluid that soaked her underwear, she has not felt that since. She endorses+FM. She was seen at [facility] on 7/12 for spotting, pt states she was 4cm then. She was diagnosed with bacterial vaginosis and sent home, she has not yet  picked up her medication to treat BV.   Of note Lakesia was admitted [facility] for threatened preterm labor on 6/19, she received Betamethasone  x2 and Magnesium  Sulfate then.   Christopher receives her care at [facility], her fundal height was noted to 24cm at her last appointment on 7/10/, she scheduled to have a growth US  at her next visit.   Pregravid weight 54.9 kg Total Weight Gain -2.812 kg  EDD 10/22/23 Problems (from 05/02/23 to present)     Problem Noted Diagnosed Resolved   Encounter for supervision of normal pregnancy, antepartum 05/02/2023 by Ilean Rutherford HERO, RN  No   Overview Addendum 08/22/2023 11:50 AM by Ozan, Jennifer, DO   NURSING  PROVIDER  Office Location [facility] Dating by U/S at 12 wks  [facility] Model Traditional Anatomy U/S Normal female 'John'  Initiated care at  12wks tx care to FT 15wks from [facility]                Language  English              LAB RESULTS   Support Person  Genetics NIPS: LR boy AFP:     NT/IT (FT only)     Carrier Screen Horizon:   Rhogam  O/Positive/-- (02/04 0000) A1C/GTT Early HgbA1C:  Third trimester 2 hr GTT: 81/64/77  Flu Vaccine     TDaP Vaccine  6/18 Blood Type O/Positive/-- (02/04 0000)  RSV Vaccine  Antibody Negative (02/04 0000)  COVID Vaccine  Rubella Immune (02/04 0000)  Feeding Plan bottle RPR Nonreactive (02/04 0000)   Contraception Not depo/nexp HBsAg Negative (02/04 0000)  Circumcision yes HIV Non-reactive (02/04 0000)  Pediatrician  [facility] HCVAb Negative (02/04 0000)  Prenatal Classes discussed    BTL Consent  Pap 04/10/23 neg  BTL Pre-payment  GC/CT Initial:  -/- 36wks:    VBAC Consent N/a GBS   For PCN allergy, check sensitivities   BRx Optimized? [ ]  yes   [ ]  no    DME Rx [ x] BP cuff [ ]  Weight Scale Waterbirth  [ ]  Class [ ]  Consent [ ]  CNM visit  PHQ9 & GAD7 [ x ] new OB [  ] 28 weeks  [  ] 36 weeks Induction  [ ]  Orders Entered [ ] Foley Y/N              Review of Systems: 10 point review of systems negative unless otherwise noted in HPI  Past Medical History: Patient Active Problem List   Diagnosis Date Noted   Indication for care in labor and delivery, antepartum 09/18/2023   Uterine contractions 08/23/2023   Threatened preterm labor 08/23/2023   Depression with anxiety 06/05/2023    On zoloft  150mg  ~50yr ago   06/05/23 zoloft  25mg /IBH    History of first degree heart block 06/05/2023    Per her report, dx  in [place]  Feb 2024   OB cards referral 2/27  06/05/23 hasn't responded to messages trying to schedule, gave her numbers to call them    Encounter for supervision of normal pregnancy, antepartum 05/02/2023     NURSING  PROVIDER  Office Location [facility] Dating by U/S at 12 wks  [facility] Model Traditional Anatomy U/S Normal female 'John'  Initiated care at  12wks tx care to FT 15wks from [facility]                Language  English              LAB RESULTS   Support Person  Genetics NIPS: LR boy AFP:     NT/IT (FT only)     Carrier Screen Horizon:   Rhogam  O/Positive/-- (02/04 0000) A1C/GTT Early HgbA1C:  Third trimester 2 hr GTT: 81/64/77  Flu Vaccine     TDaP Vaccine  6/18 Blood Type O/Positive/-- (02/04 0000)  RSV Vaccine  Antibody Negative (02/04 0000)  COVID Vaccine  Rubella Immune (02/04 0000)  Feeding Plan bottle RPR Nonreactive (02/04 0000)   Contraception Not depo/nexp HBsAg Negative (02/04 0000)  Circumcision yes HIV Non-reactive (02/04 0000)  Pediatrician  [facility] HCVAb Negative (02/04 0000)  Prenatal Classes discussed    BTL Consent  Pap 04/10/23 neg  BTL Pre-payment  GC/CT Initial:  -/- 36wks:    VBAC Consent N/a GBS   For PCN allergy, check sensitivities   BRx Optimized? [ ]  yes   [ ]  no    DME Rx [ x] BP cuff [ ]  Weight Scale Waterbirth  [ ]  Class [ ]  Consent [ ]  CNM visit  PHQ9 & GAD7 [ x ] new OB [  ] 28 weeks  [  ] 36 weeks Induction  [ ]  Orders Entered [ ] Foley Y/N       Suicidal ideation 12/21/2022   ADHD (attention deficit hyperactivity disorder) 05/22/2022   Recurrent seizures (HCC) 05/22/2022    Last seizure 12/24   on keppra , sees neuro 5/19    Asthma 05/10/2018    Formatting of this note might be different from the original. Albuterol  2x per week for exacerbation with activity. Given frequency of symptoms in pregnancy >> plan to initiate fluticasone daily [ ]  peak flows next visit Last Assessment & Plan: Formatting of this note might be different from the original. Albuterol  2x per week for exacerbation with activity. Given frequency of symptoms in pregnancy >> plan to initiate fluticasone daily     Past Surgical History: Past Surgical History:  Procedure Laterality Date   right wrist surgery     WISDOM TOOTH EXTRACTION      Past Obstetric History: # 1 - Date: 04/10/14, Sex: Female, Weight: 2948 g, GA: [redacted]w[redacted]d, Type: Vaginal, Spontaneous, Apgar1: None, Apgar5: None, Living: Living, Birth Comments: None  # 2 - Date: 10/16/18, Sex: Female, Weight: 2945 g, GA: [redacted]w[redacted]d, Type: Vaginal, Spontaneous, Apgar1: None, Apgar5: None, Living: Living, Birth Comments: None  # 3 - Date: None, Sex: None, Weight: None, GA: None, Type: None, Apgar1: None, Apgar5: None, Living: None, Birth Comments: None   Past Gynecologic History:  Family History: Family History  Problem Relation Age of Onset   Diabetes Mother     Diabetes Maternal Grandmother    Breast cancer Maternal Grandmother    Cancer Paternal Grandmother    Colon cancer Paternal Grandmother     Social History: Social History   Socioeconomic History   Marital  status: Married    Spouse name: Not on file   Number of children: Not on file   Years of education: Not on file   Highest education level: Not on file  Occupational History   Not on file  Tobacco Use   Smoking status: Never   Smokeless tobacco: Never  Vaping Use   Vaping status: Former  Substance and Sexual Activity   Alcohol use: Not Currently   Drug use: No   Sexual activity: Yes    Birth control/protection: None  Other Topics Concern   Not on file  Social History Narrative   Not on file   Social Drivers of Health   Financial Resource Strain: Low Risk  (07/06/2023)   [facility]   Overall Financial Resource Strain (CARDIA)    Difficulty of Paying Living Expenses: Not hard at all  Food Insecurity: No Food Insecurity (08/23/2023)   Hunger Vital Sign    Worried About Running Out of Food in the Last Year: Never true    Ran Out of Food in the Last Year: Never true  Transportation Needs: No Transportation Needs (08/23/2023)   PRAPARE - Administrator, Civil Service (Medical): No    Lack of Transportation (Non-Medical): No  Physical Activity: Sufficiently Active (05/03/2023)   Exercise Vital Sign    Days of Exercise per Week: 7 days    Minutes of Exercise per Session: 100 min  Stress: No Stress Concern Present (05/03/2023)   [employer] of Occupational Health - Occupational Stress Questionnaire    Feeling of Stress : Not at all  Social Connections: Socially Isolated (05/03/2023)   Social Connection and Isolation Panel    Frequency of Communication with Friends and Family: More than three times a week    Frequency of Social Gatherings with Friends and Family: More than three times a week    Attends Religious Services: Never    Automotive engineer or Organizations: No    Attends Banker Meetings: Never    Marital Status: Separated  Intimate Partner Violence: Not At Risk (08/23/2023)   Humiliation, Afraid, Rape, and Kick questionnaire    Fear of Current or Ex-Partner: No    Emotionally Abused: No    Physically Abused: No    Sexually Abused: No    Medications: Prior to Admission medications   Medication Sig Start Date End Date Taking? Authorizing Provider  albuterol  (VENTOLIN  HFA) 108 (90 Base) MCG/ACT inhaler Inhale 2 puffs into the lungs every 6 (six) hours as needed for wheezing or shortness of breath. 12/21/22  Yes Arloa Suzen RAMAN, NP  levETIRAcetam  (KEPPRA ) 500 MG tablet Take 1 tablet (500 mg total) by mouth 2 (two) times daily. 08/01/23  Yes Loreli Suzen D, CNM  ondansetron  (ZOFRAN -ODT) 4 MG disintegrating tablet Take 1 tablet (4 mg total) by mouth every 6 (six) hours as needed for nausea. 06/05/23  Yes Kizzie Suzen SAUNDERS, CNM  sertraline  (ZOLOFT ) 25 MG tablet Take 1 tablet (25 mg total) by mouth daily. 06/05/23  Yes Kizzie Suzen SAUNDERS, CNM  Blood Pressure Monitor MISC For regular home bp monitoring during pregnancy 05/03/23   Newton Mering, CNM    Allergies: Allergies  Allergen Reactions   Lamotrigine Hives and Rash   Latex Rash   Naproxen Rash and Hives    Physical Exam: Vitals: Blood pressure (!) 90/56, pulse 64, temperature 98 F (36.7 C), temperature source Oral, resp. rate 14, height 5' 6 (1.676 m), weight 52.1  kg.    AYLENE ACOFF 04/09/1995 [redacted]w[redacted]d  Fetus A Non-Stress Test Interpretation for 09/18/23  Indication: threatened preterm labor   Fetal Heart Rate A Mode: External Baseline Rate (A): 140 bpm Variability: Moderate Accelerations: 15 x 15 Decelerations: None  Uterine Activity Mode: Toco Contraction Frequency (min): one noted Contraction Duration (sec): 100 Contraction Quality: Mild Resting Tone Palpated: Relaxed Resting Time: Adequate        General: NAD HEENT: normocephalic, anicteric Pulmonary: No increased work of breathing Cardiovascular: RRR, distal pulses 2+ Abdomen: Gravid, non-tender Leopolds:S<D Genitourinary: thick yellow white discharge present on perineum  Extremities: no edema, erythema, or tenderness Neurologic: Grossly intact Psychiatric: mood appropriate, affect full  SVE 4/50/high, large amount of stool palpated through vaginal floor.   Labs: Results for orders placed or performed during the hospital encounter of 09/18/23 (from the past 24 hours)  Rupture of Membrane (ROM) Plus     Status: None   Collection Time: 09/18/23  6:00 PM  Result Value Ref Range   Rom Plus NEGATIVE     Assessment: 28 y.o. [id number] [redacted]w[redacted]d by 10/22/2023, by Ultrasound admitted for observation for threatened preterm labor at 35wks1day and breech   Plan: 1)IV fluid bolus given, terb given, US  ordered, home meds Keppra  and Zoloft  ordered.   2) Fetus -category 1tracing   3) PNL - Blood type O/Positive/-- (02/04 0000) / Anti-bodyscreen Negative (05/28 0846) / Rubella Nonimmune (02/04 0000) / Varicella unknown / RPR Non Reactive (05/28 0846) / HBsAg Negative (02/04 0000) / HIV Non Reactive (05/28 0846) / 1-hr OGTT WNL / GBS PRESUMPTIVE NEGATIVE/-- (06/19 1903)  4) Immunization History -  Immunization History  Administered Date(s) Administered   Tdap 08/22/2023    5) Disposition - observe overnight, consider discharge in the am if no cervical change  Pt received Betamethasone  x2 on 6/19, per Mom baby betamethasone  is not warranted at this time.   Dr Suzanna aware of pt and plan   Jinnie Cookey, CNM  [facility] 09/18/2023 7:31 PM

## 2023-09-18 NOTE — OB Triage Note (Signed)
 Patient is a 28 yo, G3P2, at 35 weeks 1 days. Patient presents with complaints of contractions and LOF. Patient states her contractions have been going on for the past 24 hours and are now 5 minutes apart and are getting more frequent and intense. Patient also thinks her water may have broken around 1700 yesterday.  Patient denies any vaginal bleeding. Patient reports +FM. Monitors applied and assessing. VSS. Initial fetal heart tone 155. Dominic  CNM notified of patients arrival to unit. Plan to place in observation for IV hydration, ROM+, and NST.

## 2023-09-19 ENCOUNTER — Other Ambulatory Visit: Payer: Self-pay

## 2023-09-19 DIAGNOSIS — O4703 False labor before 37 completed weeks of gestation, third trimester: Secondary | ICD-10-CM | POA: Diagnosis not present

## 2023-09-19 DIAGNOSIS — O320XX0 Maternal care for unstable lie, not applicable or unspecified: Secondary | ICD-10-CM | POA: Diagnosis present

## 2023-09-19 MED ORDER — ONDANSETRON 4 MG PO TBDP
4.0000 mg | ORAL_TABLET | Freq: Four times a day (QID) | ORAL | Status: DC | PRN
  Administered 2023-09-19: 4 mg via ORAL
  Filled 2023-09-19: qty 1

## 2023-09-19 MED ORDER — METRONIDAZOLE 500 MG PO TABS
500.0000 mg | ORAL_TABLET | Freq: Two times a day (BID) | ORAL | 0 refills | Status: AC
  Filled 2023-09-19: qty 14, 7d supply, fill #0

## 2023-09-19 NOTE — OB Triage Note (Signed)
 Pt being discharged home by Jayne, stable and ambulatory w her SO. Pt's recent cervical check showed no change and pt denies painful ctx's currently. Discharge instructions reviewed and pt verbalized understanding. Pt aware to return for ctx's, abdominal pain/pressure, vaginal bleeding, lof, or decreased fetal movement. Pt recommended to have an office visit on 09/24/23 at 11:50.

## 2023-09-19 NOTE — Discharge Summary (Addendum)
 LABOR & DELIVERY OB TRIAGE NOTE  SUBJECTIVE  HPI Maureen George is a 28 y.o. H6E7997 at [redacted]w[redacted]d who presented to Labor & Delivery for contractions & loss of fluid. She was treated with IVF bolus & a single dose of terbutaline . She previously received betamethasone  x2 6/19-6/20. She was diagnosed with BV at a triage visit at [facility] 7/12 but has not been able to get her prescription. At arrival she reported contractions every 28m for about 24h. Contractions now have decreased in frequency & intensity, she has been able to rest and feels comfortable returning home.   OB History     Gravida  3   Para  2   Term  2   Preterm      AB      Living  2      SAB      IAB      Ectopic      Multiple      Live Births  2           Scheduled Meds:  levETIRAcetam   500 mg Oral BID   metroNIDAZOLE   500 mg Oral Q12H   sertraline   25 mg Oral Daily    PRN Meds:.ondansetron   OBJECTIVE  BP 114/75 (BP Location: Left Arm)   Pulse 99   Temp 98.1 F (36.7 C) (Oral)   Resp 18   Ht 5' 6 (1.676 m)   Wt 52.1 kg   BMI 18.53 kg/m   General: A&Ox3, NAD Heart: regular rate Lungs: normal work of breathing Abdomen: soft, nontender Cervical exam: Dilation: 4 Effacement (%): 60 Cervical Position: Middle Station: -3 Presentation: Vertex Exam by:: George, CNM  Exam unchanged over >12h  NST I reviewed the NST and it was reactive.  Baseline: 140 Variability: moderate Accelerations: present Decelerations: absent Toco: occasional, irregular contractions Category I  Results for orders placed or performed during the hospital encounter of 09/18/23 (from the past 24 hours)  Rupture of Membrane (ROM) Plus     Status: None   Collection Time: 09/18/23  6:00 PM  Result Value Ref Range   Rom Plus NEGATIVE     US  OB Limited Result Date: 09/18/2023 CLINICAL DATA:  [id number] Uterine size date discrepancy pregnancy, third trimester [id number] EXAM: LIMITED OBSTETRIC ULTRASOUND COMPARISON:  June 05, 2023 FINDINGS: Number of Fetuses: 1 Heart Rate:  147 bpm Movement: Present Presentation: Variable, breech initially, and transverse towards the end of the exam. Placental Location: Anteriorly, extending into the fundus, grade 1 Previa: No Amniotic Fluid (Subjective):  Within normal limits. AFI: 17.2 cm BPD: 8.5 cm 34 w  1 d MATERNAL FINDINGS: Cervix:  Appears closed.  3 cm in length Uterus/Adnexae: No abnormality visualized. IMPRESSION: Single live intrauterine gestation, variable presentation, and estimated gestational age of [redacted] weeks, 1 day. Fetal heart rate of 147 beats per minute. Electronically Signed   By: Rogelia Myers M.D.   On: 09/18/2023 21:27    ASSESSMENT Impression  1) Pregnancy at [id number], [redacted]w[redacted]d, Estimated Date of Delivery: 10/22/23 2) Reassuring maternal/fetal status 3) Threatened preterm labor, betamethasone  complete 4) BV in pregnancy 5) Unstable lie-breech to now vertex, assess presenting part in labor  PLAN Discharge home with preterm labor & fetal movement precautions. Appointment made Monday 7/21 1150am for ROB at [facility] in [place]. Exam essentially unchanged, contractions have reduced in frequency & intensity. Metronidazole  prescription provided to patient through Med[facility] to [facility], will complete course.   Maureen George, CNM 09/19/23  2:20 PM

## 2023-09-19 NOTE — Progress Notes (Signed)
 Maureen George is a 28 y.o. G3P2002 at [redacted]w[redacted]d by ultrasound admitted for Preterm labor  Subjective: Resting quietly, reports feeling cramping, but not very often.   Objective: BP (!) 82/44   Pulse (!) 58   Temp 97.9 F (36.6 C) (Oral)   Resp 16   Ht 5' 6 (1.676 m)   Wt 52.1 kg   BMI 18.53 kg/m  No intake/output data recorded. No intake/output data recorded.  FHT:  FHR: 130 bpm, variability: moderate,  accelerations:  Present,  decelerations:  Absent UC:   3-6 minutes  SVE:   Dilation: 4 Effacement (%): 70 Station: -3 Exam by:: L Tanis Burnley CNM Vertex   Labs: Lab Results  Component Value Date   WBC 5.8 08/01/2023   HGB 11.3 08/01/2023   HCT 34.9 08/01/2023   MCV 95 08/01/2023   PLT 210 08/01/2023    Assessment / Plan: Threatened preterm labor  Labor: cervical thinning noted, fetus now vertex  Fetal Wellbeing:  Category I  Reviewed pt's exam and contraction pattern with Dr Suzanna, will continue to monitor pt in house and consider discharge later today     JINNIE HERO [facility], CNM 09/19/2023, 7:35 AM

## 2023-09-19 NOTE — TOC Initial Note (Signed)
 Transition of Care [facility]) - Initial/Assessment Note    Patient Details  Name: Maureen George MRN: [id number] Date of Birth: [birth year]  Transition of Care [facility]) CM/SW Contact:    [place] L Amandalee Lacap, LCSW Phone Number: 09/19/2023, 3:12 PM  Clinical Narrative:                  Cab voucher provided to patient. CSW advised RN to explain to patient that a consistent and permanent arrangement is needed for transportation as this voucher is being provided as a one time courtesy.   TOC signing off.        Patient Goals and CMS Choice            Expected Discharge Plan and Services         Expected Discharge Date: 09/19/23                                    Prior Living Arrangements/Services                       Activities of Daily Living      Permission Sought/Granted                  Emotional Assessment              Admission diagnosis:  Indication for care in labor and delivery, antepartum [O75.9] Patient Active Problem List   Diagnosis Date Noted   Unstable lie of fetus, antepartum 09/19/2023   Indication for care in labor and delivery, antepartum 09/18/2023   Uterine contractions 08/23/2023   Threatened preterm labor 08/23/2023   Depression with anxiety 06/05/2023   History of first degree heart block 06/05/2023   Encounter for supervision of normal pregnancy, antepartum 05/02/2023   Suicidal ideation 12/21/2022   ADHD (attention deficit hyperactivity disorder) 05/22/2022   Recurrent seizures (HCC) 05/22/2022   Asthma 05/10/2018   PCP:  [employer] Pharmacy:   [facility] - [place], [place] - [address] [place] [address] Phone: [phone] Fax: [phone]  [facility] [address] [place] [address] Phone: [phone] Fax: [phone]     Social Drivers of Health (SDOH) Social History: SDOH Screenings   Food  Insecurity: No Food Insecurity (08/23/2023)  Housing: Low Risk  (08/23/2023)  Transportation Needs: No Transportation Needs (08/23/2023)  Utilities: Not At Risk (08/23/2023)  Alcohol Screen: Low Risk  (05/03/2023)  Depression (PHQ2-9): Low Risk  (08/01/2023)  Financial Resource Strain: Low Risk  (07/06/2023)   Received from [facility]  Physical Activity: Sufficiently Active (05/03/2023)  Social Connections: Socially Isolated (05/03/2023)  Stress: No Stress Concern Present (05/03/2023)  Tobacco Use: Low Risk  (09/18/2023)  Health Literacy: Adequate Health Literacy (05/03/2023)   SDOH Interventions:     Readmission Risk Interventions     No data to display

## 2023-09-24 ENCOUNTER — Ambulatory Visit: Payer: MEDICAID | Admitting: Obstetrics and Gynecology

## 2023-09-24 VITALS — BP 96/59 | HR 57 | Wt 113.6 lb

## 2023-09-24 DIAGNOSIS — G40909 Epilepsy, unspecified, not intractable, without status epilepticus: Secondary | ICD-10-CM | POA: Diagnosis not present

## 2023-09-24 DIAGNOSIS — O4703 False labor before 37 completed weeks of gestation, third trimester: Secondary | ICD-10-CM

## 2023-09-24 DIAGNOSIS — Z3480 Encounter for supervision of other normal pregnancy, unspecified trimester: Secondary | ICD-10-CM

## 2023-09-24 DIAGNOSIS — O26843 Uterine size-date discrepancy, third trimester: Secondary | ICD-10-CM

## 2023-09-24 DIAGNOSIS — Z8679 Personal history of other diseases of the circulatory system: Secondary | ICD-10-CM | POA: Diagnosis not present

## 2023-09-24 DIAGNOSIS — Z3A36 36 weeks gestation of pregnancy: Secondary | ICD-10-CM | POA: Diagnosis not present

## 2023-09-24 NOTE — Progress Notes (Unsigned)
   PRENATAL VISIT NOTE  Subjective:  Maureen George is a 28 y.o. G3P2002 at [redacted]w[redacted]d being seen today for ongoing prenatal care.  She is currently monitored for the following issues for this low-risk pregnancy and has ADHD (attention deficit hyperactivity disorder); Recurrent seizures (HCC); Asthma; Suicidal ideation; Encounter for supervision of normal pregnancy, antepartum; Depression with anxiety; History of first degree heart block; Uterine contractions; Threatened preterm labor; Indication for care in labor and delivery, antepartum; and Unstable lie of fetus, antepartum on their problem list.  Patient reports no complaints.  Contractions: Irritability. Vag. Bleeding: None.  Movement: Present. Denies leaking of fluid.   The following portions of the patient's history were reviewed and updated as appropriate: allergies, current medications, past family history, past medical history, past social history, past surgical history and problem list.   Objective:    Vitals:   09/24/23 1200  BP: (!) 96/59  Pulse: (!) 57  Weight: 113 lb 9.6 oz (51.5 kg)    Fetal Status:      Movement: Present    General: Alert, oriented and cooperative. Patient is in no acute distress.  Skin: Skin is warm and dry. No rash noted.   Cardiovascular: Normal heart rate noted  Respiratory: Normal respiratory effort, no problems with respiration noted  Abdomen: Soft, gravid, appropriate for gestational age.  Pain/Pressure: Present     Pelvic: Cervical exam deferred        Extremities: Normal range of motion.     Mental Status: Normal mood and affect. Normal behavior. Normal judgment and thought content.   Assessment and Plan:  Pregnancy: G3P2002 at [redacted]w[redacted]d 1. Supervision of other normal pregnancy, antepartum (Primary) BP and FHR normal Doing well, feeling regular movement  No S&S PTL, precautions discussed   2. [redacted] weeks gestation of pregnancy Gc/ct neg at [facility], gbs screen neg  Addend: upon review gbs screen PCR,  will obtain culture at follow up visit   3. Threatened premature labor in third trimester Admit 6/19-6/21 BMZ x2  4. Recurrent seizures (HCC) On keppra   5. History of first degree heart block Has not seen ob cardiology, consult was closed   6. Uterine size-date discrepancy in third trimester Normal growth to date 6/18 EFW 54%, follow up u/s 7/28   Preterm labor symptoms and general obstetric precautions including but not limited to vaginal bleeding, contractions, leaking of fluid and fetal movement were reviewed in detail with the patient. Please refer to After Visit Summary for other counseling recommendations.    Future Appointments  Date Time Provider Department Center  10/01/2023  8:30 AM [facility] US  [facility] None  10/01/2023  9:30 AM Ozan, Jennifer, DO [facility]    Nidia Daring, FNP

## 2023-09-26 NOTE — Nursing Note (Signed)
 Pt is a G3P2002 at 36.2 weeks presenting to triage for r/o labor. Pt VS WNL. Pt had a reactive NST. Pt evaluated by Trompeter MD. Cervical exam is 4/50/-3 at 0307. Membranes intact. Patient stable to discharge following evaluation per Trompeter MD. Pt given thorough discharge instructions. Pt verbalizes understanding of discharge teaching. All questions and concerns have been addressed at this time.

## 2023-09-26 NOTE — Care Plan (Signed)
 Voucher for Transportation and [employer] [facility] Emergency Department  Voucher# = MRN# + Request Date MRN# [id number] Request Date 09/26/2023  Type of Transportation: Discharge Transportation Pick up location: [facility] Patient Maureen George Patient Feb 07, 1996 Patient Facility: [facility] Department Authorizing this voucher: [facility] Emergency Department     Transportation Voucher Info Discharge Voucher Type: Taxi: Destination Address: [address] [place], [place]: [place], [place]: [place], [place] Phone: [phone], and Taxi Operator: Gen[employer] [phone] alternate number [phone]   Amount: $   [facility] Customer ID: [id number] NO ADDITIONAL STOPS    *Universal Pandemic Precautions - all patients and families are instructed to wear masks during transport*

## 2023-09-29 ENCOUNTER — Encounter: Payer: Self-pay | Admitting: Obstetrics & Gynecology

## 2023-10-01 ENCOUNTER — Ambulatory Visit: Payer: MEDICAID | Admitting: Obstetrics & Gynecology

## 2023-10-01 ENCOUNTER — Encounter: Payer: Self-pay | Admitting: Obstetrics & Gynecology

## 2023-10-01 ENCOUNTER — Other Ambulatory Visit: Payer: Self-pay | Admitting: Women's Health

## 2023-10-01 ENCOUNTER — Ambulatory Visit: Payer: MEDICAID

## 2023-10-01 VITALS — BP 100/66 | HR 58 | Wt 117.2 lb

## 2023-10-01 DIAGNOSIS — O26843 Uterine size-date discrepancy, third trimester: Secondary | ICD-10-CM

## 2023-10-01 DIAGNOSIS — Z3483 Encounter for supervision of other normal pregnancy, third trimester: Secondary | ICD-10-CM

## 2023-10-01 DIAGNOSIS — O365990 Maternal care for other known or suspected poor fetal growth, unspecified trimester, not applicable or unspecified: Secondary | ICD-10-CM

## 2023-10-01 DIAGNOSIS — Z3480 Encounter for supervision of other normal pregnancy, unspecified trimester: Secondary | ICD-10-CM

## 2023-10-01 DIAGNOSIS — O365930 Maternal care for other known or suspected poor fetal growth, third trimester, not applicable or unspecified: Secondary | ICD-10-CM

## 2023-10-01 DIAGNOSIS — O0993 Supervision of high risk pregnancy, unspecified, third trimester: Secondary | ICD-10-CM | POA: Diagnosis not present

## 2023-10-01 DIAGNOSIS — Z3A37 37 weeks gestation of pregnancy: Secondary | ICD-10-CM | POA: Diagnosis not present

## 2023-10-01 NOTE — Progress Notes (Signed)
 HIGH-RISK PREGNANCY VISIT Patient name: Maureen George MRN [id number]  Date of birth: [birth year] Chief Complaint:   Routine Prenatal Visit  History of Present Illness:   Maureen George is a 28 y.o. G56P2002 female at [redacted]w[redacted]d with an Estimated Date of Delivery: 10/22/23 being seen today for ongoing management of a high-risk pregnancy complicated by:  -h/o seizure d/o  -Anxiety  Patient notes that she has been in and out of multiple hospitals due to contractions and vaginal bleeding; however her symptoms have remained about the same.  She denies vaginal bleeding currently  Contractions: Irritability. Vag. Bleeding: None.  Movement: Present. denies leaking of fluid.      08/01/2023    9:05 AM 07/18/2023    8:26 AM 05/03/2023    3:14 PM  Depression screen PHQ 2/9  Decreased Interest 0 0 0  Down, Depressed, Hopeless 0 1 0  PHQ - 2 Score 0 1 0  Altered sleeping 0 0 0  Tired, decreased energy 1 0 0  Change in appetite 0 0 1  Feeling bad or failure about yourself  0 0 0  Trouble concentrating 0 0 0  Moving slowly or fidgety/restless 0 0 0  Suicidal thoughts 0 0 0  PHQ-9 Score 1 1 1      Current Outpatient Medications  Medication Instructions   albuterol  (VENTOLIN  HFA) 108 (90 Base) MCG/ACT inhaler 2 puffs, Inhalation, Every 6 hours PRN   Blood Pressure Monitor MISC For regular home bp monitoring during pregnancy   levETIRAcetam  (KEPPRA ) 500 mg, Oral, 2 times daily   ondansetron  (ZOFRAN -ODT) 4 mg, Oral, Every 6 hours PRN   sertraline  (ZOLOFT ) 25 mg, Oral, Daily     Review of Systems:   Pertinent items are noted in HPI Denies abnormal vaginal discharge w/ itching/odor/irritation, headaches, visual changes, shortness of breath, chest pain, abdominal pain, severe nausea/vomiting, or problems with urination or bowel movements unless otherwise stated above. Pertinent History Reviewed:  Reviewed past medical,surgical, social, obstetrical and family history.  Reviewed problem list,  medications and allergies. Physical Assessment:   Vitals:   10/01/23 0908  BP: 100/66  Pulse: (!) 58  Weight: 117 lb 3.2 oz (53.2 kg)  Body mass index is 18.92 kg/m.           Physical Examination:   General appearance: alert, well appearing, and in no distress  Mental status: normal mood, behavior, speech, dress, motor activity, and thought processes  Skin: warm & dry   Extremities:   no edema   Cardiovascular: normal heart rate noted  Respiratory: normal respiratory effort, no distress  Abdomen: gravid, soft, non-tender  Pelvic: Cervical exam deferred  Dilation: 3.5 Effacement (%): 50 Station: -2  Fetal Status:     Movement: Present    Fetal Surveillance Testing today: cephalic,FHR 143 bpm,BPP 8/8,anterior placenta gr 2,AFI 13 cm,RI .61,.57,.61=62%,EFW 2726 g 23%,AC 8%    Chaperone: Dr. Damien Cassis    No results found for this or any previous visit (from the past 24 hours).   Assessment & Plan:  High-risk pregnancy: G3P2002 at [redacted]w[redacted]d with an Estimated Date of Delivery: 10/22/23   1) FGR - Diagnosed by today's ultrasound with AC 8% - Discussed IOL at 38 weeks, scheduled for August 4 at midnight - Will plan for NST this Thursday, BPP 8/8 today  2) seizure disorder - On Keppra   3) anxiety - Stable on Zoloft   Meds: No orders of the defined types were placed in this encounter.   Labs/procedures today: BPP,  growth, []  GBS collected today  Treatment Plan: As outlined above, plan for IOL August 4  Reviewed: Term labor symptoms and general obstetric precautions including but not limited to vaginal bleeding, contractions, leaking of fluid and fetal movement were reviewed in detail with the patient.  All questions were answered.   Follow-up: Return for please schedule NST Thurs (7/31), IOL scheduled Monday Aug 4.   Future Appointments  Date Time Provider Department Center  10/01/2023  9:30 AM Husna Krone, DO [facility]  10/04/2023  2:30 PM [facility] NURSE [facility]  [facility]  10/08/2023 12:00 AM [facility] SCHED ROOM [facility] None    Orders Placed This Encounter  Procedures   Culture, beta strep (group b only)    Delon Prude, DO Attending Obstetrician & Gynecologist, [facility] for [employer], [facility]

## 2023-10-01 NOTE — Progress Notes (Signed)
 US  37 wks,cephalic,FHR 143 bpm,BPP 8/8,anterior placenta gr 2,AFI 13 cm,RI .61,.57,.61=62%,EFW 2726 g 23%,AC 8%

## 2023-10-04 ENCOUNTER — Ambulatory Visit: Payer: MEDICAID

## 2023-10-04 ENCOUNTER — Telehealth (HOSPITAL_COMMUNITY): Payer: Self-pay | Admitting: *Deleted

## 2023-10-04 VITALS — BP 95/58 | HR 75 | Wt 116.0 lb

## 2023-10-04 DIAGNOSIS — O365930 Maternal care for other known or suspected poor fetal growth, third trimester, not applicable or unspecified: Secondary | ICD-10-CM

## 2023-10-04 DIAGNOSIS — Z3A37 37 weeks gestation of pregnancy: Secondary | ICD-10-CM | POA: Diagnosis not present

## 2023-10-04 DIAGNOSIS — O365990 Maternal care for other known or suspected poor fetal growth, unspecified trimester, not applicable or unspecified: Secondary | ICD-10-CM

## 2023-10-04 NOTE — Progress Notes (Signed)
   NURSE VISIT- NST  SUBJECTIVE:  Maureen George is a 28 y.o. G91P2002 female at [redacted]w[redacted]d, here for a NST for pregnancy complicated by FGR.  She reports active fetal movement, contractions: none, vaginal bleeding: none, membranes: intact.   OBJECTIVE:  BP (!) 95/58   Pulse 75   Wt 116 lb (52.6 kg)   BMI 18.72 kg/m   Appears well, no apparent distress  No results found for this or any previous visit (from the past 24 hours).  NST: FHR baseline 135 bpm, Variability: moderate, Accelerations:present, Decelerations:  Absent= Cat 1/reactive Toco: none   ASSESSMENT: G3P2002 at [redacted]w[redacted]d with FGR NST reactive  PLAN: EFM strip reviewed by Luke Fetters, CNM, [facility]   Recommendations: keep next appointment as scheduled    Alan LITTIE Fischer  10/04/2023 2:51 PM

## 2023-10-04 NOTE — Telephone Encounter (Signed)
 Preadmission screen

## 2023-10-05 ENCOUNTER — Telehealth (HOSPITAL_COMMUNITY): Payer: Self-pay | Admitting: *Deleted

## 2023-10-05 ENCOUNTER — Ambulatory Visit: Payer: Self-pay | Admitting: Obstetrics & Gynecology

## 2023-10-05 LAB — CULTURE, BETA STREP (GROUP B ONLY)

## 2023-10-05 NOTE — Telephone Encounter (Signed)
 Preadmission screen

## 2023-10-07 ENCOUNTER — Encounter: Payer: Self-pay | Admitting: Obstetrics and Gynecology

## 2023-10-08 ENCOUNTER — Other Ambulatory Visit: Payer: Self-pay

## 2023-10-08 ENCOUNTER — Encounter (HOSPITAL_COMMUNITY): Payer: Self-pay | Admitting: Obstetrics & Gynecology

## 2023-10-08 ENCOUNTER — Inpatient Hospital Stay (HOSPITAL_COMMUNITY): Payer: MEDICAID | Admitting: Anesthesiology

## 2023-10-08 ENCOUNTER — Inpatient Hospital Stay (HOSPITAL_COMMUNITY): Payer: MEDICAID

## 2023-10-08 ENCOUNTER — Inpatient Hospital Stay (HOSPITAL_COMMUNITY)
Admission: RE | Admit: 2023-10-08 | Discharge: 2023-10-10 | DRG: 806 | Disposition: A | Discharge status: Home / Self Care | Payer: MEDICAID | Attending: Family Medicine | Admitting: Family Medicine

## 2023-10-08 DIAGNOSIS — O99354 Diseases of the nervous system complicating childbirth: Secondary | ICD-10-CM | POA: Diagnosis present

## 2023-10-08 DIAGNOSIS — F32A Depression, unspecified: Secondary | ICD-10-CM | POA: Diagnosis present

## 2023-10-08 DIAGNOSIS — O99344 Other mental disorders complicating childbirth: Secondary | ICD-10-CM | POA: Diagnosis present

## 2023-10-08 DIAGNOSIS — Z833 Family history of diabetes mellitus: Secondary | ICD-10-CM

## 2023-10-08 DIAGNOSIS — Z79899 Other long term (current) drug therapy: Secondary | ICD-10-CM

## 2023-10-08 DIAGNOSIS — O365930 Maternal care for other known or suspected poor fetal growth, third trimester, not applicable or unspecified: Principal | ICD-10-CM | POA: Diagnosis present

## 2023-10-08 DIAGNOSIS — O365990 Maternal care for other known or suspected poor fetal growth, unspecified trimester, not applicable or unspecified: Principal | ICD-10-CM | POA: Diagnosis present

## 2023-10-08 DIAGNOSIS — F419 Anxiety disorder, unspecified: Secondary | ICD-10-CM | POA: Diagnosis present

## 2023-10-08 DIAGNOSIS — R001 Bradycardia, unspecified: Secondary | ICD-10-CM | POA: Diagnosis present

## 2023-10-08 DIAGNOSIS — Z91040 Latex allergy status: Secondary | ICD-10-CM

## 2023-10-08 DIAGNOSIS — Z3A38 38 weeks gestation of pregnancy: Secondary | ICD-10-CM | POA: Diagnosis not present

## 2023-10-08 DIAGNOSIS — Z886 Allergy status to analgesic agent status: Secondary | ICD-10-CM | POA: Diagnosis not present

## 2023-10-08 DIAGNOSIS — G40909 Epilepsy, unspecified, not intractable, without status epilepticus: Secondary | ICD-10-CM | POA: Diagnosis present

## 2023-10-08 DIAGNOSIS — I440 Atrioventricular block, first degree: Secondary | ICD-10-CM | POA: Diagnosis present

## 2023-10-08 DIAGNOSIS — Z8673 Personal history of transient ischemic attack (TIA), and cerebral infarction without residual deficits: Secondary | ICD-10-CM | POA: Diagnosis not present

## 2023-10-08 DIAGNOSIS — Z888 Allergy status to other drugs, medicaments and biological substances status: Secondary | ICD-10-CM | POA: Diagnosis not present

## 2023-10-08 LAB — CBC
HCT: 32.9 % — ABNORMAL LOW (ref 36.0–46.0)
Hemoglobin: 10.3 g/dL — ABNORMAL LOW (ref 12.0–15.0)
MCH: 26.8 pg (ref 26.0–34.0)
MCHC: 31.3 g/dL (ref 30.0–36.0)
MCV: 85.5 fL (ref 80.0–100.0)
Platelets: 217 K/uL (ref 150–400)
RBC: 3.85 MIL/uL — ABNORMAL LOW (ref 3.87–5.11)
RDW: 14.1 % (ref 11.5–15.5)
WBC: 7.6 K/uL (ref 4.0–10.5)
nRBC: 0 % (ref 0.0–0.2)

## 2023-10-08 LAB — RPR: RPR Ser Ql: NONREACTIVE

## 2023-10-08 LAB — TYPE AND SCREEN
ABO/RH(D): O POS
Antibody Screen: NEGATIVE

## 2023-10-08 LAB — HIV ANTIBODY (ROUTINE TESTING W REFLEX): HIV Screen 4th Generation wRfx: NONREACTIVE

## 2023-10-08 MED ORDER — OXYCODONE-ACETAMINOPHEN 5-325 MG PO TABS: 1.0000 | ORAL_TABLET | ORAL | Status: DC | PRN

## 2023-10-08 MED ORDER — SOD CITRATE-CITRIC ACID 500-334 MG/5ML PO SOLN: 30.0000 mL | ORAL | Status: DC | PRN

## 2023-10-08 MED ORDER — LIDOCAINE HCL (PF) 1 % IJ SOLN: 30.0000 mL | INTRAMUSCULAR | Status: DC | PRN

## 2023-10-08 MED ORDER — LEVETIRACETAM ER 500 MG PO TB24: 500.0000 mg | ORAL_TABLET | Freq: Two times a day (BID) | ORAL | Status: DC

## 2023-10-08 MED ORDER — ONDANSETRON HCL 4 MG/2ML IJ SOLN
4.0000 mg | Freq: Four times a day (QID) | INTRAMUSCULAR | Status: DC | PRN
  Administered 2023-10-08: 4 mg via INTRAVENOUS
  Filled 2023-10-08: qty 2

## 2023-10-08 MED ORDER — TRANEXAMIC ACID-NACL 1000-0.7 MG/100ML-% IV SOLN
INTRAVENOUS | Status: AC
  Administered 2023-10-08: 1000 mg
  Filled 2023-10-08: qty 100

## 2023-10-08 MED ORDER — SENNOSIDES-DOCUSATE SODIUM 8.6-50 MG PO TABS
2.0000 | ORAL_TABLET | Freq: Every day | ORAL | Status: DC
  Administered 2023-10-09 – 2023-10-10 (×2): 2 via ORAL
  Filled 2023-10-08 (×2): qty 2

## 2023-10-08 MED ORDER — SODIUM CHLORIDE 0.9 % IV SOLN
12.5000 mg | Freq: Once | INTRAVENOUS | Status: AC
  Administered 2023-10-08: 12.5 mg via INTRAVENOUS
  Filled 2023-10-08: qty 12.5

## 2023-10-08 MED ORDER — ACETAMINOPHEN 325 MG PO TABS: 650.0000 mg | ORAL_TABLET | ORAL | Status: DC | PRN

## 2023-10-08 MED ORDER — DIPHENHYDRAMINE HCL 25 MG PO CAPS
25.0000 mg | ORAL_CAPSULE | Freq: Four times a day (QID) | ORAL | Status: DC | PRN
Start: 2023-10-08 — End: 2023-10-10
  Administered 2023-10-09: 25 mg via ORAL
  Filled 2023-10-08: qty 1

## 2023-10-08 MED ORDER — LEVETIRACETAM ER 500 MG PO TB24
500.0000 mg | ORAL_TABLET | Freq: Two times a day (BID) | ORAL | Status: DC
  Administered 2023-10-08 – 2023-10-10 (×4): 500 mg via ORAL
  Filled 2023-10-08 (×5): qty 1

## 2023-10-08 MED ORDER — TERBUTALINE SULFATE 1 MG/ML IJ SOLN
0.2500 mg | Freq: Once | INTRAMUSCULAR | Status: AC | PRN
  Administered 2023-10-08: 0.25 mg via SUBCUTANEOUS
  Filled 2023-10-08: qty 1

## 2023-10-08 MED ORDER — ONDANSETRON HCL 4 MG PO TABS: 4.0000 mg | ORAL_TABLET | ORAL | Status: DC | PRN

## 2023-10-08 MED ORDER — BENZOCAINE-MENTHOL 20-0.5 % EX AERO: 1.0000 | INHALATION_SPRAY | CUTANEOUS | Status: DC | PRN

## 2023-10-08 MED ORDER — COCONUT OIL OIL
1.0000 | TOPICAL_OIL | Status: DC | PRN
Start: 2023-10-08 — End: 2023-10-10

## 2023-10-08 MED ORDER — ALBUTEROL SULFATE (2.5 MG/3ML) 0.083% IN NEBU: 3.0000 mL | INHALATION_SOLUTION | Freq: Four times a day (QID) | RESPIRATORY_TRACT | Status: DC | PRN

## 2023-10-08 MED ORDER — OXYTOCIN-SODIUM CHLORIDE 30-0.9 UT/500ML-% IV SOLN
1.0000 m[IU]/min | INTRAVENOUS | Status: DC
  Administered 2023-10-08: 2 m[IU]/min via INTRAVENOUS
  Filled 2023-10-08: qty 500

## 2023-10-08 MED ORDER — OXYTOCIN BOLUS FROM INFUSION
333.0000 mL | Freq: Once | INTRAVENOUS | Status: AC
  Administered 2023-10-08: 333 mL via INTRAVENOUS

## 2023-10-08 MED ORDER — CEFAZOLIN SODIUM-DEXTROSE 2-4 GM/100ML-% IV SOLN
2.0000 g | Freq: Once | INTRAVENOUS | Status: AC
  Administered 2023-10-08: 2 g via INTRAVENOUS
  Filled 2023-10-08: qty 100

## 2023-10-08 MED ORDER — LIDOCAINE HCL (PF) 1 % IJ SOLN
INTRAMUSCULAR | Status: DC | PRN
  Administered 2023-10-08: 5 mL via EPIDURAL

## 2023-10-08 MED ORDER — EPHEDRINE 5 MG/ML INJ: 10.0000 mg | INTRAVENOUS | Status: DC | PRN

## 2023-10-08 MED ORDER — METHYLERGONOVINE MALEATE 0.2 MG/ML IJ SOLN
INTRAMUSCULAR | Status: AC
  Administered 2023-10-08: 0.2 mg
  Filled 2023-10-08: qty 1

## 2023-10-08 MED ORDER — LACTATED RINGERS IV SOLN
500.0000 mL | Freq: Once | INTRAVENOUS | Status: AC
  Administered 2023-10-08: 500 mL via INTRAVENOUS

## 2023-10-08 MED ORDER — MEASLES, MUMPS & RUBELLA VAC IJ SOLR
0.5000 mL | Freq: Once | INTRAMUSCULAR | Status: AC
  Administered 2023-10-10: 0.5 mL via SUBCUTANEOUS
  Filled 2023-10-08: qty 0.5

## 2023-10-08 MED ORDER — SERTRALINE HCL 25 MG PO TABS
25.0000 mg | ORAL_TABLET | Freq: Every day | ORAL | Status: DC
  Administered 2023-10-08: 25 mg via ORAL
  Filled 2023-10-08: qty 1

## 2023-10-08 MED ORDER — ONDANSETRON HCL 4 MG/2ML IJ SOLN: 4.0000 mg | INTRAMUSCULAR | Status: DC | PRN

## 2023-10-08 MED ORDER — PHENYLEPHRINE 80 MCG/ML (10ML) SYRINGE FOR IV PUSH (FOR BLOOD PRESSURE SUPPORT): 80.0000 ug | PREFILLED_SYRINGE | INTRAVENOUS | Status: DC | PRN

## 2023-10-08 MED ORDER — ZOLPIDEM TARTRATE 5 MG PO TABS: 5.0000 mg | ORAL_TABLET | Freq: Every evening | ORAL | Status: DC | PRN

## 2023-10-08 MED ORDER — FENTANYL-BUPIVACAINE-NACL 0.5-0.125-0.9 MG/250ML-% EP SOLN
EPIDURAL | Status: AC
  Filled 2023-10-08: qty 250

## 2023-10-08 MED ORDER — FENTANYL CITRATE (PF) 100 MCG/2ML IJ SOLN: 50.0000 ug | INTRAMUSCULAR | Status: DC | PRN

## 2023-10-08 MED ORDER — SERTRALINE HCL 25 MG PO TABS
25.0000 mg | ORAL_TABLET | Freq: Every day | ORAL | Status: DC
  Administered 2023-10-09 – 2023-10-10 (×2): 25 mg via ORAL
  Filled 2023-10-08 (×2): qty 1

## 2023-10-08 MED ORDER — LACTATED RINGERS IV SOLN: 500.0000 mL | INTRAVENOUS | Status: DC | PRN

## 2023-10-08 MED ORDER — DIPHENHYDRAMINE HCL 50 MG/ML IJ SOLN: 12.5000 mg | INTRAMUSCULAR | Status: DC | PRN

## 2023-10-08 MED ORDER — OXYTOCIN-SODIUM CHLORIDE 30-0.9 UT/500ML-% IV SOLN: 2.5000 [IU]/h | INTRAVENOUS | Status: DC

## 2023-10-08 MED ORDER — WITCH HAZEL-GLYCERIN EX PADS
1.0000 | MEDICATED_PAD | CUTANEOUS | Status: DC | PRN
Start: 2023-10-08 — End: 2023-10-10

## 2023-10-08 MED ORDER — DIBUCAINE (PERIANAL) 1 % EX OINT: 1.0000 | TOPICAL_OINTMENT | CUTANEOUS | Status: DC | PRN

## 2023-10-08 MED ORDER — VARICELLA VIRUS VACCINE LIVE 1350 PFU/0.5ML IJ SUSR
0.5000 mL | Freq: Once | INTRAMUSCULAR | Status: AC
  Administered 2023-10-10: 0.5 mL via SUBCUTANEOUS
  Filled 2023-10-08: qty 0.5

## 2023-10-08 MED ORDER — FENTANYL-BUPIVACAINE-NACL 0.5-0.125-0.9 MG/250ML-% EP SOLN: 12.0000 mL/h | EPIDURAL | Status: DC | PRN

## 2023-10-08 MED ORDER — LACTATED RINGERS IV SOLN: INTRAVENOUS | Status: DC

## 2023-10-08 MED ORDER — SIMETHICONE 80 MG PO CHEW: 80.0000 mg | CHEWABLE_TABLET | ORAL | Status: DC | PRN

## 2023-10-08 MED ORDER — FENTANYL-BUPIVACAINE-NACL 0.5-0.125-0.9 MG/250ML-% EP SOLN
EPIDURAL | Status: DC | PRN
  Administered 2023-10-08: 12 mL/h via EPIDURAL

## 2023-10-08 MED ORDER — OXYCODONE-ACETAMINOPHEN 5-325 MG PO TABS: 2.0000 | ORAL_TABLET | ORAL | Status: DC | PRN

## 2023-10-08 MED ORDER — PRENATAL MULTIVITAMIN CH
1.0000 | ORAL_TABLET | Freq: Every day | ORAL | Status: DC
  Administered 2023-10-09 – 2023-10-10 (×2): 1 via ORAL
  Filled 2023-10-08 (×2): qty 1

## 2023-10-08 MED ORDER — LEVETIRACETAM 500 MG PO TABS
500.0000 mg | ORAL_TABLET | Freq: Two times a day (BID) | ORAL | Status: DC
  Administered 2023-10-08 (×2): 500 mg via ORAL
  Filled 2023-10-08 (×3): qty 1

## 2023-10-08 NOTE — Discharge Summary (Signed)
 Postpartum Discharge Summary  Date of Service updated***     Patient Name: Maureen George DOB: 1995/11/27 MRN: [id number]  Date of admission: 10/08/2023 Delivery date:10/08/2023 Delivering provider: TRUDY CZAR B Date of discharge: 10/08/2023  Admitting diagnosis: Fetal growth restriction antepartum [O36.5990] Intrauterine pregnancy: [redacted]w[redacted]d     Secondary diagnosis:  Active Problems:   Fetal growth restriction antepartum   Bradycardia  Additional problems: IUGR diagnosed at 37 wks, h/o seizure d/o (keppra ), anxiety/depression (on zoloft ).   Discharge diagnosis: Term Pregnancy Delivered                                              Post partum procedures:{Postpartum procedures:23558} Augmentation: AROM Complications: None  Hospital course: Onset of Labor With Vaginal Delivery      28 y.o. yo H6E6996 at [redacted]w[redacted]d was admitted in Latent Labor on 10/08/2023. Labor course was uncomplicated Membrane Rupture Time/Date: 5:24 AM,10/08/2023  Delivery Method:Vaginal, Spontaneous Operative Delivery:N/A Episiotomy: None Lacerations:  Labial Patient had a postpartum course complicated by ***.  She is ambulating, tolerating a regular diet, passing flatus, and urinating well. Patient is discharged home in stable condition on 10/08/23.  Newborn Data: Birth date:10/[birth year]/2023 Birth time:9:45 AM Gender:Female Living status:Living Apgars:9 ,9  Weight:2840 g  Magnesium  Sulfate received: No BMZ received: No Rhophylac:No MMR:{MMR:[id number]} T-DaP:Given prenatally Flu: N/A RSV Vaccine received: No Transfusion:No  Immunizations received: Immunization History  Administered Date(s) Administered   Tdap 08/22/2023    Physical exam  Vitals:   10/08/23 0731 10/08/23 0812 10/08/23 0831 10/08/23 1016  BP: 132/85 (!) 93/54 (!) 101/58 (!) 114/91  Pulse: (!) 120 89 (!) 59   Resp: 16 16 14    Temp:  98.8 F (37.1 C)    TempSrc:  Oral    SpO2:    99%  Weight:      Height:       General: {Exam;  general:21111117} Lochia: {Desc; appropriate/inappropriate:30686::appropriate} Uterine Fundus: {Desc; firm/soft:30687} Incision: {Exam; incision:21111123} DVT Evaluation: {Exam; dvt:2111122} Labs: Lab Results  Component Value Date   WBC 7.6 10/08/2023   HGB 10.3 (L) 10/08/2023   HCT 32.9 (L) 10/08/2023   MCV 85.5 10/08/2023   PLT 217 10/08/2023      Latest Ref Rng & Units 12/20/2022    7:34 PM  CMP  Glucose 70 - 99 mg/dL 85   BUN 6 - 20 mg/dL 10   Creatinine 9.55 - 1.00 mg/dL 9.35   Sodium 864 - 854 mmol/L 140   Potassium 3.5 - 5.1 mmol/L 4.9   Chloride 98 - 111 mmol/L 106   CO2 22 - 32 mmol/L 26   Calcium  8.9 - 10.3 mg/dL 89.0   Total Protein 6.5 - 8.1 g/dL 6.6   Total Bilirubin 0.3 - 1.2 mg/dL 0.4   Alkaline Phos 38 - 126 U/L 81   AST 15 - 41 U/L 17   ALT 0 - 44 U/L 18    Edinburgh Score:     No data to display         No data recorded  After visit meds:  Allergies as of 10/08/2023       Reactions   Lamotrigine Hives, Rash   Latex Rash   Naproxen Rash, Hives     Med Rec must be completed prior to using this [facility]***        Discharge home in stable condition  Infant Feeding: {Baby feeding:23562} Infant Disposition:{CHL IP OB HOME WITH MOTHER:23581}, SW consult--pt does not have custody of other children Discharge instruction: per After Visit Summary and Postpartum booklet. Activity: Advance as tolerated. Pelvic rest for 6 weeks.  Diet: routine diet Future Appointments:No future appointments. Follow up Visit:   Please schedule this patient for a In person postpartum visit in 4 weeks with the following provider: Any provider. Additional Postpartum F/U:Postpartum Depression checkup  High risk pregnancy complicated by: IUGR Delivery mode:  Vaginal, Spontaneous Anticipated Birth Control:  {Birth Control:23956}   10/08/2023 Leeroy KATHEE Pouch, MD

## 2023-10-08 NOTE — Anesthesia Procedure Notes (Signed)
 Epidural Patient location during procedure: OB Start time: 10/08/2023 3:14 AM End time: 10/08/2023 3:26 AM  Staffing Anesthesiologist: Jefm Garnette LABOR, MD Performed: anesthesiologist   Preanesthetic Checklist Completed: patient identified, IV checked, site marked, risks and benefits discussed, surgical consent, monitors and equipment checked, pre-op evaluation and timeout performed  Epidural Patient position: sitting Prep: DuraPrep and site prepped and draped Patient monitoring: continuous pulse ox and blood pressure Approach: midline Location: L3-L4 Injection technique: LOR air  Needle:  Needle type: Tuohy  Needle gauge: 17 G Needle length: 9 cm and 9 Needle insertion depth: 4 cm Catheter type: closed end flexible Catheter size: 19 Gauge Catheter at skin depth: 9 cm Test dose: negative  Assessment Events: blood not aspirated, no cerebrospinal fluid, injection not painful, no injection resistance, no paresthesia and negative IV test  Additional Notes Patient identified. Risks/Benefits/Options discussed with patient including but not limited to bleeding, infection, nerve damage, paralysis, failed block, incomplete pain control, headache, blood pressure changes, nausea, vomiting, reactions to medication both or allergic, itching and postpartum back pain. Confirmed with bedside nurse the patient's most recent platelet count. Confirmed with patient that they are not currently taking any anticoagulation, have any bleeding history or any family history of bleeding disorders. Patient expressed understanding and wished to proceed. All questions were answered. Sterile technique was used throughout the entire procedure. Please see nursing notes for vital signs. Test dose was given through epidural needle and negative prior to continuing to dose epidural or start infusion. Warning signs of high block given to the patient including shortness of breath, tingling/numbness in hands, complete motor  block, or any concerning symptoms with instructions to call for help. Patient was given instructions on fall risk and not to get out of bed. All questions and concerns addressed with instructions to call with any issues.  1 Attempt (S) . Patient tolerated procedure well.

## 2023-10-08 NOTE — Plan of Care (Signed)

## 2023-10-08 NOTE — Progress Notes (Signed)
 L&D Note  10/08/2023 - 5:22 AM  28 y.o. H6E7997 [redacted]w[redacted]d. Pregnancy complicated by:  Patient Active Problem List   Diagnosis Date Noted   Fetal growth restriction antepartum 10/08/2023   Bradycardia 10/08/2023   Unstable lie of fetus, antepartum 09/19/2023   Depression with anxiety 06/05/2023   History of first degree heart block 06/05/2023   Encounter for supervision of normal pregnancy, antepartum 05/02/2023   ADHD (attention deficit hyperactivity disorder) 05/22/2022   Recurrent seizures (HCC) 05/22/2022   Asthma 05/10/2018    Maureen George is admitted for IOL for new FGR   Subjective:  Patient comfortable with epidural in place. No cardiac or respiratory s/s. Objective:   Vitals:   10/08/23 0356 10/08/23 0358 10/08/23 0359 10/08/23 0402  BP: 93/78 (!) 84/45 (!) 88/46 (!) 86/39  Pulse: (!) 52 (!) 49 (!) 46 (!) 45  Resp:      Temp:      TempSrc:      SpO2:      Weight:      Height:        Current Vital Signs 24h Vital Sign Ranges  T 97.6 F (36.4 C) Temp  Avg: 97.7 F (36.5 C)  Min: 97.6 F (36.4 C)  Max: 97.8 F (36.6 C)  BP (!) 86/39 (patient asymptomatic) BP  Min: 84/45  Max: 108/73  HR (!) 45 Pulse  Avg: 52.4  Min: 40  Max: 69  RR 18 Resp  Avg: 18  Min: 18  Max: 18  SaO2 100 %   SpO2  Avg: 99.3 %  Min: 98 %  Max: 100 %       24 Hour I/O Current Shift I/O  Time Ins Outs No intake/output data recorded. No intake/output data recorded.   FHR: 125 baseline, +accels, no decel, mod variability Toco: q13m Gen: NAD CV: HR high 40s-50s, regular. Normal s1 and s2, no MRGs.  Pulm: CTAB SVE: 6-7/70/-1>>AROM clear  Labs:  Recent Labs  Lab 10/08/23 0045  WBC 7.6  HGB 10.3*  HCT 32.9*  PLT 217    Medications Current Facility-Administered Medications  Medication Dose Route Frequency Provider Last Rate Last Admin   acetaminophen  (TYLENOL ) tablet 650 mg  650 mg Oral Q4H PRN Ozan, Jennifer, DO       diphenhydrAMINE  (BENADRYL ) injection 12.5 mg  12.5 mg  Intravenous Q15 min PRN Izell Harari, MD       ePHEDrine  injection 10 mg  10 mg Intravenous PRN Izell Harari, MD       ePHEDrine  injection 10 mg  10 mg Intravenous PRN Izell Harari, MD       fentaNYL  (SUBLIMAZE ) injection 50-100 mcg  50-100 mcg Intravenous Q1H PRN Ozan, Jennifer, DO       fentaNYL  2 mcg/mL w/ bupivacaine  0.125% in NS 250 mL epidural infusion  12 mL/hr Epidural Continuous PRN Izell Harari, MD       lactated ringers  infusion 500-1,000 mL  500-1,000 mL Intravenous PRN Ozan, Jennifer, DO       lactated ringers  infusion   Intravenous Continuous Ozan, Jennifer, DO 125 mL/hr at 10/08/23 0102 New Bag at 10/08/23 0102   levETIRAcetam  (KEPPRA ) tablet 500 mg  500 mg Oral BID Izell Harari, MD   500 mg at 10/08/23 0119   lidocaine  (PF) (XYLOCAINE ) 1 % injection 30 mL  30 mL Subcutaneous PRN Ozan, Jennifer, DO       ondansetron  (ZOFRAN ) injection 4 mg  4 mg Intravenous Q6H PRN Ozan, Jennifer, DO  oxytocin  (PITOCIN ) IV BOLUS FROM BAG  333 mL Intravenous Once Ozan, Jennifer, DO       oxytocin  (PITOCIN ) IV infusion 30 units in NS 500 mL - Premix  2.5 Units/hr Intravenous Continuous Ozan, Jennifer, DO       oxytocin  (PITOCIN ) IV infusion 30 units in NS 500 mL - Premix  1-40 milli-units/min Intravenous Titrated Izell Harari, MD 4 mL/hr at 10/08/23 0230 4 milli-units/min at 10/08/23 0230   PHENYLephrine  80 mcg/ml in normal saline Adult IV Push Syringe (For Blood Pressure Support)  80-160 mcg Intravenous PRN Izell Harari, MD       PHENYLephrine  80 mcg/ml in normal saline Adult IV Push Syringe (For Blood Pressure Support)  80 mcg Intravenous PRN Izell Harari, MD       sertraline  (ZOLOFT ) tablet 25 mg  25 mg Oral Daily Izell Harari, MD       sodium citrate-citric acid  (ORACIT) solution 30 mL  30 mL Oral Q2H PRN Ozan, Jennifer, DO       terbutaline  (BRETHINE ) injection 0.25 mg  0.25 mg Subcutaneous Once PRN Izell Harari, MD       Facility-Administered  Medications Ordered in Other Encounters  Medication Dose Route Frequency Provider Last Rate Last Admin   fentaNYL  2 mcg/mL w/ bupivacaine  0.125% in NS 250 mL epidural infusion   Epidural Continuous PRN Jefm Garnette LABOR, MD 12 mL/hr at 10/08/23 0323 12 mL/hr at 10/08/23 0323   lidocaine  (PF) (XYLOCAINE ) 1 % injection   Epidural Anesthesia Intra-op Jefm Garnette LABOR, MD   5 mL at 10/08/23 0319    Assessment & Plan:  Patient stable *Labor: continue pitocin .  *CV: pt asymptomatic. Will do telemetry. There's a visit from 04/2020 with [facility] cardiology for syncope. Patient had BP 94/58 and HR 47 when lying, 62-73 with standing and stable BP. 05/2020 echo wnl and tele monitoring in the ED with 1st degree AV block seen. Will continue to monitor *GBS: negative *Seizure d/o: no issues on home bid keppra . *Social: SW consult PP *Analgesia: epidural working well  Harari Izell Raddle. MD Attending Center for [employer] Midwife)

## 2023-10-08 NOTE — Anesthesia Preprocedure Evaluation (Signed)
 Anesthesia Evaluation  Patient identified by MRN, date of birth, ID band Patient awake    Reviewed: Allergy & Precautions, NPO status , Patient's Chart, lab work & pertinent test results  Airway Mallampati: II  TM Distance: >3 FB Neck ROM: Full    Dental no notable dental hx. (+) Teeth Intact, Dental Advisory Given   Pulmonary asthma    Pulmonary exam normal breath sounds clear to auscultation       Cardiovascular negative cardio ROS Normal cardiovascular exam Rhythm:Regular Rate:Normal     Neuro/Psych Seizures - (last 6 months ago), Well Controlled,  PSYCHIATRIC DISORDERS Anxiety Depression    ADHD   GI/Hepatic negative GI ROS, Neg liver ROS,,,  Endo/Other  negative endocrine ROS    Renal/GU negative Renal ROS     Musculoskeletal   Abdominal   Peds  Hematology Lab Results      Component                Value               Date                      WBC                      7.6                 10/08/2023                HGB                      10.3 (L)            10/08/2023                HCT                      32.9 (L)            10/08/2023                MCV                      85.5                10/08/2023                PLT                      217                 10/08/2023              Anesthesia Other Findings   Reproductive/Obstetrics (+) Pregnancy                              Anesthesia Physical Anesthesia Plan  ASA: 2  Anesthesia Plan: Epidural   Post-op Pain Management:    Induction:   PONV Risk Score and Plan:   Airway Management Planned:   Additional Equipment:   Intra-op Plan:   Post-operative Plan:   Informed Consent: I have reviewed the patients History and Physical, chart, labs and discussed the procedure including the risks, benefits and alternatives for the proposed anesthesia with the patient or authorized representative who has indicated his/her  understanding and acceptance.       Plan Discussed  with:   Anesthesia Plan Comments: (38 wk G3P2 for LEA)        Anesthesia Quick Evaluation

## 2023-10-08 NOTE — Progress Notes (Signed)
 Maureen George is a 28 y.o. G3P2002 at [redacted]w[redacted]d.  Subjective: Pressure with UC's.   Objective: BP 116/70   Pulse (!) 46   Temp 97.6 F (36.4 C) (Oral)   Resp 18   Ht 5' 6 (1.676 m)   Wt 53.8 kg   SpO2 100%   BMI 19.13 kg/m    FHT:  FHR: 125 bpm, variability: min-mod,  accelerations:  15x15,  decelerations:  new repetitive variables  UC:   Q 2-3 minutes, strong Dilation: 10 Effacement (%): 100 Station: -1 Presentation: Vertex Exam by:: RN  Labs: Results for orders placed or performed during the hospital encounter of 10/08/23 (from the past 24 hours)  Type and screen     Status: None   Collection Time: 10/08/23 12:44 AM  Result Value Ref Range   ABO/RH(D) O POS    Antibody Screen NEG    Sample Expiration      10/11/2023,2359 Performed at [facility], [address], [place], [place] [address]   CBC     Status: Abnormal   Collection Time: 10/08/23 12:45 AM  Result Value Ref Range   WBC 7.6 4.0 - 10.5 K/uL   RBC 3.85 (L) 3.87 - 5.11 MIL/uL   Hemoglobin 10.3 (L) 12.0 - 15.0 g/dL   HCT 67.0 (L) 63.9 - 53.9 %   MCV 85.5 80.0 - 100.0 fL   MCH 26.8 26.0 - 34.0 pg   MCHC 31.3 30.0 - 36.0 g/dL   RDW 85.8 88.4 - 84.4 %   Platelets 217 150 - 400 K/uL   nRBC 0.0 0.0 - 0.2 %    Assessment / Plan: [redacted]w[redacted]d week IUP ROM x Hours: 1 Minutes: 27 Labor: Start second stage. Progressing well Fetal Wellbeing:  Category II, improved w/ D/C pit, position changes to hands and knees and terbutaline . CTO closely. Dr. Izell updated.   Pain Control:  Epidural Anticipated MOD:  SVD  Maureen George , CNM 10/08/2023 6:51 AM

## 2023-10-08 NOTE — H&P (Signed)
 Obstetrics Admission History & Physical  10/08/2023 - 12:46 AM Primary OBGYN: [facility]  Chief Complaint: IOL for new FGR  History of Present Illness  28 y.o. G3P2002 at [redacted]w[redacted]d, with the above CC. Pregnancy complicated by: new FGR at 37wks (AC 8%, EFW 23%, UA dopplers wnl). H/o seizure d/o, anxiety/depression (on zoloft ).  Ms. Maureen George states that she is doing well and no OB s/s.   Review of Systems: as noted in the History of Present Illness.  Patient Active Problem List   Diagnosis Date Noted   Fetal growth restriction antepartum 10/08/2023   Unstable lie of fetus, antepartum 09/19/2023   Uterine contractions 08/23/2023   Threatened preterm labor 08/23/2023   Depression with anxiety 06/05/2023   History of first degree heart block 06/05/2023   Encounter for supervision of normal pregnancy, antepartum 05/02/2023   Suicidal ideation 12/21/2022   ADHD (attention deficit hyperactivity disorder) 05/22/2022   Recurrent seizures (HCC) 05/22/2022   Asthma 05/10/2018    PMHx:  Past Medical History:  Diagnosis Date   ADHD (attention deficit hyperactivity disorder)    Anxiety    Asthma    Depression    Heart murmur    History of first degree AV block    *04/2021 1' heart block EKG. card consult made/closed d/t pt not responding neg EKG Oct 2024   Mini stroke    PTSD (post-traumatic stress disorder)    Seizures (HCC)    Vaginal Pap smear, abnormal    PSHx:  Past Surgical History:  Procedure Laterality Date   right wrist surgery     WISDOM TOOTH EXTRACTION     Medications:  Medications Prior to Admission  Medication Sig Dispense Refill Last Dose/Taking   albuterol  (VENTOLIN  HFA) 108 (90 Base) MCG/ACT inhaler Inhale 2 puffs into the lungs every 6 (six) hours as needed for wheezing or shortness of breath. 8 g 0    Blood Pressure Monitor MISC For regular home bp monitoring during pregnancy 1 each 0    levETIRAcetam  (KEPPRA ) 500 MG tablet Take 1 tablet (500 mg total) by mouth  2 (two) times daily. 60 tablet 11    ondansetron  (ZOFRAN -ODT) 4 MG disintegrating tablet Take 1 tablet (4 mg total) by mouth every 6 (six) hours as needed for nausea. 30 tablet 2    sertraline  (ZOLOFT ) 25 MG tablet Take 1 tablet (25 mg total) by mouth daily. 30 tablet 6      Allergies: is allergic to lamotrigine, latex, and naproxen. OBHx:  OB History  Gravida Para Term Preterm AB Living  3 2 2   2   SAB IAB Ectopic Multiple Live Births      2    # Outcome Date GA Lbr Len/2nd Weight Sex Type Anes PTL Lv  3 Current           2 Term 10/16/18 [redacted]w[redacted]d  2945 g F Vag-Spont EPI N LIV  1 Term 04/10/14 [redacted]w[redacted]d  2948 g M Vag-Spont  N LIV           FHx:  Family History  Problem Relation Age of Onset   Diabetes Mother    Diabetes Maternal Grandmother    Breast cancer Maternal Grandmother    Cancer Paternal Grandmother    Colon cancer Paternal Grandmother    Soc Hx:  Social History   Socioeconomic History   Marital status: Married    Spouse name: Not on file   Number of children: Not on file   Years of education:  Not on file   Highest education level: Not on file  Occupational History   Not on file  Tobacco Use   Smoking status: Never   Smokeless tobacco: Never  Vaping Use   Vaping status: Former  Substance and Sexual Activity   Alcohol use: Not Currently   Drug use: No   Sexual activity: Yes    Birth control/protection: None  Other Topics Concern   Not on file  Social History Narrative   Not on file   Social Drivers of Health   Financial Resource Strain: Low Risk  (07/06/2023)   Received from [facility]   Overall Financial Resource Strain (CARDIA)    Difficulty of Paying Living Expenses: Not hard at all  Food Insecurity: No Food Insecurity (08/23/2023)   Hunger Vital Sign    Worried About Running Out of Food in the Last Year: Never true    Ran Out of Food in the Last Year: Never true  Transportation Needs: No Transportation Needs (08/23/2023)   PRAPARE -  Administrator, Civil Service (Medical): No    Lack of Transportation (Non-Medical): No  Physical Activity: Sufficiently Active (05/03/2023)   Exercise Vital Sign    Days of Exercise per Week: 7 days    Minutes of Exercise per Session: 100 min  Stress: No Stress Concern Present (05/03/2023)   [employer] of Occupational Health - Occupational Stress Questionnaire    Feeling of Stress : Not at all  Social Connections: Socially Isolated (05/03/2023)   Social Connection and Isolation Panel    Frequency of Communication with Friends and Family: More than three times a week    Frequency of Social Gatherings with Friends and Family: More than three times a week    Attends Religious Services: Never    Database administrator or Organizations: No    Attends Banker Meetings: Never    Marital Status: Separated  Intimate Partner Violence: Not At Risk (08/23/2023)   Humiliation, Afraid, Rape, and Kick questionnaire    Fear of Current or Ex-Partner: No    Emotionally Abused: No    Physically Abused: No    Sexually Abused: No    Objective  VS pending  EFM: 130 baseline, +accels, no decel, mod variability  Toco: q2-60m  General: Well nourished, well developed female in no acute distress.  Skin:  Warm and dry.  Cardiovascular: S1, S2 normal, no murmur, rub or gallop, regular rate and rhythm Respiratory:  Clear to auscultation bilateral. Normal respiratory effort Abdomen: gravid, nttp Neuro/Psych:  Normal mood and affect.   SVE: 5cm/BOW intact/high per RN Bedside u/s by me: cephalic, normal FHR  Labs  pending  Radiology 7/28: efw 23%, 2726g, ac 8%, bpp 8/8, UA dopplers wnl, cephalic.   Perinatal info  ABO, Rh: O/Positive/-- (02/04 0000) Antibody: Negative (05/28 0846) Rubella: Nonimmune (02/04 0000) RPR: Non Reactive (05/28 0846)  HBsAg: Negative (02/04 0000)  HIV: Non Reactive (05/28 0846)  HAD:Wzhjupcz/-- (07/28 1448) 2 hr GTT  wnl Assessment & Plan    28 y.o. G3P2002 at [redacted]w[redacted]d doing well *IOL: d/w her and pt amenable to low dose pitocin  to bring down baby and AROM thereafter *Seizure d/o: pt states last one six months ago and confirms on keppra  500 bid but didn't take dose tonight *Anxiety/depression: confirms home zoloft  *Social: consult PP *GBS: negative *Analgesia: no current needs  Bebe Izell Overcast MD Attending Center for [facility] (Faculty Practice)

## 2023-10-09 NOTE — Progress Notes (Signed)
 When this RN entered the room the pt had removed the seizure pads from the bed. RN educated on the importance of the pads due to pt's hx of seizures. Pt verbalized understanding and still requested the pads remained off the bed.  Licia Harl, RN

## 2023-10-09 NOTE — Progress Notes (Signed)
 Post Partum Day 1 Subjective: no complaints, up ad lib, voiding and tolerating PO, small lochia, plans to bottle feed, still unsure of BC, not depo or Nexplanon  Objective: Blood pressure (!) 83/49, pulse (!) 51, temperature 97.8 F (36.6 C), temperature source Oral, resp. rate 17, height 5' 6 (1.676 m), weight 53.8 kg, SpO2 97%, unknown if currently breastfeeding.  Physical Exam:  General: alert, cooperative and no distress Lochia:normal flow Chest: CTAB Heart: RRR no m/r/g Abdomen: +BS, soft, nontender,  Uterine Fundus: firm DVT Evaluation: No evidence of DVT seen on physical exam. Extremities: no edema  Recent Labs    10/08/23 0045  HGB 10.3*  HCT 32.9*    Assessment/Plan: Plan for discharge tomorrow, Social Work consult, and Circumcision prior to discharge   LOS: 1 day   Cathlean Ely 10/09/2023, 6:30 AM

## 2023-10-09 NOTE — Anesthesia Postprocedure Evaluation (Signed)
 Anesthesia Post Note  Patient: Maureen George  Procedure(s) Performed: AN AD HOC LABOR EPIDURAL     Patient location during evaluation: Mother Baby Anesthesia Type: Epidural Level of consciousness: awake Pain management: satisfactory to patient Vital Signs Assessment: post-procedure vital signs reviewed and stable Respiratory status: spontaneous breathing Cardiovascular status: stable Anesthetic complications: no   No notable events documented.  Last Vitals:  Vitals:   10/09/23 0046 10/09/23 0549  BP: (!) 80/51 (!) 83/49  Pulse: (!) 50 (!) 51  Resp: 17 17  Temp:    SpO2: 99% 97%    Last Pain:  Vitals:   10/09/23 0740  TempSrc:   PainSc: 0-No pain   Pain Goal: Patients Stated Pain Goal: 2 (10/08/23 0325)                 JEANENNE HANDING

## 2023-10-10 MED ORDER — ACETAMINOPHEN 325 MG PO TABS: 650.0000 mg | ORAL_TABLET | ORAL | 0 refills | Status: AC | PRN

## 2023-10-10 MED ORDER — SENNOSIDES-DOCUSATE SODIUM 8.6-50 MG PO TABS: 2.0000 | ORAL_TABLET | Freq: Every day | ORAL | 0 refills | Status: AC

## 2023-10-10 MED ORDER — PRENATAL MULTIVITAMIN CH
1.0000 | ORAL_TABLET | Freq: Every day | ORAL | 0 refills | Status: AC
Start: 2023-10-10 — End: ?

## 2023-10-10 MED ORDER — NORETHINDRONE 0.35 MG PO TABS: 1.0000 | ORAL_TABLET | Freq: Every day | ORAL | 3 refills | Status: AC

## 2023-10-10 NOTE — Social Work (Signed)
 CSW received consult for hx of Anxiety and Depression, and concerns with custody of children.  CSW met with [name] to offer support and complete assessment.  CSW entered the room and observed [name] at bedside holding the infant and FOB at bedside. CSW introduced self, and requested to speak to [name] alone. FOB stepped out of the room during the assessment. CSW inquired about how [name] was feeling, [name] reported good. CSW inquired about [name] MH hx, [name] reported she was diagnosed in 2016, [name] reported she takes Zoloft  25mg  which has been beneficial for her mood. CSW assessed for safety, [name] denied any SI or HI. CSW provided education regarding the baby blues period vs. perinatal mood disorders, discussed treatment and gave resources for mental health follow up if concerns arise.  CSW recommends self-evaluation during the postpartum time period using the New Mom Checklist from Postpartum Progress and encouraged [name] to contact a medical professional if symptoms are noted at any time.  [name] identified her mom, FOB, Aunt and Cousin as her primary supports.   CSW confirmed [name] address and household members, [name] verified she lives with her aunt and cousin, CSW inquired about [name]'s older children, [name] reported her son Maureen George 04/10/2014 lives with his paternal aunt and her daughter Maureen George 10/16/2018 lives with maternal grandmother. CSW inquired if [name] had CPS involvement, [name] reported if she did it was years ago. CSW inquired if her family member had custody of the children, [name] stated yes. CSW made a CPS report to [facility] due to unclear CPS involvement.   CSW provided review of Sudden Infant Death Syndrome (SIDS) precautions.  [name] identified [facility] Department for infant follow up care. [name] reported she has all essential  items for the infant including a bassinet and car seat. [name] reported she receives [facility] and food stamps.  CSW identifies no further need for intervention and no barriers to discharge  at this time.  Maureen George, LCSWA Clinical Social Worker [phone]

## 2023-10-10 NOTE — Patient Instructions (Signed)

## 2023-10-12 ENCOUNTER — Encounter: Payer: Self-pay | Admitting: Obstetrics & Gynecology

## 2023-10-23 ENCOUNTER — Telehealth (HOSPITAL_COMMUNITY): Payer: Self-pay | Admitting: *Deleted

## 2023-10-23 NOTE — Telephone Encounter (Signed)
 10/23/2023  Name: DESTYNIE TOOMEY MRN: [id number] DOB: 05/05/1995  Reason for Call:  Transition of Care Hospital Discharge Call  Contact Status: Patient Contact Status: Message  Language assistant needed:          Follow-Up Questions:    Van Postnatal Depression Scale:  In the Past 7 Days:    PHQ2-9 Depression Scale:     Discharge Follow-up:    Post-discharge interventions: NA  Mliss Sieve, RN 10/23/2023 10:57

## 2023-11-10 ENCOUNTER — Encounter (HOSPITAL_COMMUNITY): Payer: Self-pay

## 2023-11-10 ENCOUNTER — Emergency Department (HOSPITAL_COMMUNITY)
Admission: EM | Admit: 2023-11-10 | Discharge: 2023-11-10 | Disposition: A | Discharge status: Home / Self Care | Payer: MEDICAID | Attending: Emergency Medicine | Admitting: Emergency Medicine

## 2023-11-10 ENCOUNTER — Emergency Department (HOSPITAL_COMMUNITY): Payer: MEDICAID

## 2023-11-10 ENCOUNTER — Other Ambulatory Visit: Payer: Self-pay

## 2023-11-10 DIAGNOSIS — S0990XA Unspecified injury of head, initial encounter: Secondary | ICD-10-CM | POA: Diagnosis present

## 2023-11-10 DIAGNOSIS — Z91040 Latex allergy status: Secondary | ICD-10-CM | POA: Diagnosis not present

## 2023-11-10 DIAGNOSIS — R079 Chest pain, unspecified: Secondary | ICD-10-CM | POA: Diagnosis not present

## 2023-11-10 DIAGNOSIS — S060X0A Concussion without loss of consciousness, initial encounter: Secondary | ICD-10-CM | POA: Insufficient documentation

## 2023-11-10 MED ORDER — ACETAMINOPHEN 500 MG PO TABS
1000.0000 mg | ORAL_TABLET | Freq: Once | ORAL | Status: AC
  Administered 2023-11-10: 1000 mg via ORAL
  Filled 2023-11-10: qty 2

## 2023-11-10 NOTE — Discharge Instructions (Signed)
 Your CT scan did not show any signs of significant brain injury, you probably do have a concussion so read the attached instructions.  Do not take anti-inflammatories if you have an allergy but you should take Tylenol  up to 650 mg every 4 hours as needed  Thank you for allowing us  to treat you in the emergency department today.  After reviewing your examination and potential testing that was done it appears that you are safe to go home.  I would like for you to follow-up with your doctor within the next several days, have them obtain your records and follow-up with them to review all potential tests and results from your visit.  If you should develop severe or worsening symptoms return to the emergency department immediately

## 2023-11-10 NOTE — ED Notes (Signed)
 [facility] [employer] notified. Driver ETA [id number].
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
## 2023-11-19 ENCOUNTER — Ambulatory Visit: Payer: MEDICAID | Admitting: Advanced Practice Midwife

## 2023-11-22 ENCOUNTER — Ambulatory Visit (INDEPENDENT_AMBULATORY_CARE_PROVIDER_SITE_OTHER): Payer: MEDICAID | Admitting: Advanced Practice Midwife

## 2023-11-22 ENCOUNTER — Encounter: Payer: Self-pay | Admitting: Advanced Practice Midwife

## 2023-11-22 DIAGNOSIS — Z3A00 Weeks of gestation of pregnancy not specified: Secondary | ICD-10-CM | POA: Diagnosis not present

## 2023-11-22 DIAGNOSIS — G40909 Epilepsy, unspecified, not intractable, without status epilepticus: Secondary | ICD-10-CM | POA: Diagnosis not present

## 2023-11-22 DIAGNOSIS — F418 Other specified anxiety disorders: Secondary | ICD-10-CM | POA: Diagnosis not present

## 2023-11-22 DIAGNOSIS — O906 Postpartum mood disturbance: Secondary | ICD-10-CM

## 2023-11-22 MED ORDER — LEVETIRACETAM 100 MG/ML PO SOLN: 500.0000 mg | Freq: Two times a day (BID) | ORAL | 1 refills | Status: DC
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
## 2023-12-05 ENCOUNTER — Encounter: Payer: Self-pay | Admitting: Advanced Practice Midwife

## 2023-12-05 DIAGNOSIS — E162 Hypoglycemia, unspecified: Secondary | ICD-10-CM

## 2023-12-05 MED ORDER — GLUCOSE BLOOD VI STRP: ORAL_STRIP | 12 refills | Status: AC

## 2023-12-05 MED ORDER — ACCU-CHEK SOFTCLIX LANCETS MISC: 12 refills | Status: AC

## 2023-12-18 ENCOUNTER — Ambulatory Visit: Payer: MEDICAID | Admitting: Neurology

## 2023-12-18 ENCOUNTER — Encounter: Payer: Self-pay | Admitting: Neurology

## 2023-12-18 ENCOUNTER — Telehealth: Payer: Self-pay | Admitting: Neurology

## 2023-12-18 NOTE — Telephone Encounter (Signed)
 Patient cancelled appointment due to scheduling conflict with another appointment.

## 2023-12-25 NOTE — Nursing Note (Signed)
 Care Management  CM paged to assist with home less resources.   CM spoke with pt in Team C RP and discussed community resources available and emergency shelter options. Pt states that she is currently staying with some friends and has a phone to follow up with resources provided. CM also discussed tent resources, pt states she has already called [facility] and they let her know they do not have any tents available at this time. Pt will follow back up with them in a few days. No other CM needs, pt states she has transportation at [place]. Olam JONETTA Doom, RN 12/25/2023 4:30 PM
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
## 2023-12-31 ENCOUNTER — Emergency Department
Admission: EM | Admit: 2023-12-31 | Discharge: 2023-12-31 | Disposition: A | Discharge status: Home / Self Care | Payer: MEDICAID | Attending: Emergency Medicine | Admitting: Emergency Medicine

## 2023-12-31 ENCOUNTER — Other Ambulatory Visit: Payer: Self-pay

## 2023-12-31 ENCOUNTER — Encounter: Payer: Self-pay | Admitting: Emergency Medicine

## 2023-12-31 ENCOUNTER — Ambulatory Visit
Admission: EM | Admit: 2023-12-31 | Discharge: 2023-12-31 | Disposition: A | Discharge status: Home / Self Care | Source: Ambulatory Visit | Attending: Emergency Medicine | Admitting: Emergency Medicine

## 2023-12-31 ENCOUNTER — Other Ambulatory Visit (HOSPITAL_COMMUNITY): Payer: Self-pay

## 2023-12-31 ENCOUNTER — Emergency Department
Admission: EM | Admit: 2023-12-31 | Discharge: 2023-12-31 | Disposition: A | Discharge status: Home / Self Care | Payer: MEDICAID | Source: Home / Self Care | Attending: Emergency Medicine | Admitting: Emergency Medicine

## 2023-12-31 DIAGNOSIS — T7421XA Adult sexual abuse, confirmed, initial encounter: Secondary | ICD-10-CM | POA: Diagnosis present

## 2023-12-31 DIAGNOSIS — R569 Unspecified convulsions: Secondary | ICD-10-CM

## 2023-12-31 DIAGNOSIS — G40909 Epilepsy, unspecified, not intractable, without status epilepticus: Secondary | ICD-10-CM | POA: Insufficient documentation

## 2023-12-31 DIAGNOSIS — Z0441 Encounter for examination and observation following alleged adult rape: Secondary | ICD-10-CM | POA: Insufficient documentation

## 2023-12-31 LAB — COMPREHENSIVE METABOLIC PANEL WITH GFR
ALT: 11 U/L (ref 0–44)
AST: 15 U/L (ref 15–41)
Albumin: 3.7 g/dL (ref 3.5–5.0)
Alkaline Phosphatase: 63 U/L (ref 38–126)
Anion gap: 8 (ref 5–15)
BUN: 8 mg/dL (ref 6–20)
CO2: 24 mmol/L (ref 22–32)
Calcium: 9.8 mg/dL (ref 8.9–10.3)
Chloride: 110 mmol/L (ref 98–111)
Creatinine, Ser: 0.88 mg/dL (ref 0.44–1.00)
GFR, Estimated: >60 mL/min (ref >60)
Glucose, Bld: 97 mg/dL (ref 70–99)
Potassium: 3.6 mmol/L (ref 3.5–5.1)
Sodium: 142 mmol/L (ref 135–145)
Total Bilirubin: 0.5 mg/dL (ref 0.0–1.2)
Total Protein: 6.7 g/dL (ref 6.5–8.1)

## 2023-12-31 LAB — HEPATITIS B SURFACE ANTIGEN: Hepatitis B Surface Ag: NONREACTIVE

## 2023-12-31 LAB — BASIC METABOLIC PANEL WITH GFR
Anion gap: 6 (ref 5–15)
BUN: 7 mg/dL (ref 6–20)
CO2: 21 mmol/L — ABNORMAL LOW (ref 22–32)
Calcium: 9.4 mg/dL (ref 8.9–10.3)
Chloride: 111 mmol/L (ref 98–111)
Creatinine, Ser: 0.64 mg/dL (ref 0.44–1.00)
GFR, Estimated: >60 mL/min (ref >60)
Glucose, Bld: 113 mg/dL — ABNORMAL HIGH (ref 70–99)
Potassium: 3.5 mmol/L (ref 3.5–5.1)
Sodium: 138 mmol/L (ref 135–145)

## 2023-12-31 LAB — CBC WITH DIFFERENTIAL/PLATELET
Abs Immature Granulocytes: 0.01 K/uL (ref 0.00–0.07)
Basophils Absolute: 0 K/uL (ref 0.0–0.1)
Basophils Relative: 1 %
Eosinophils Absolute: 0.1 K/uL (ref 0.0–0.5)
Eosinophils Relative: 3 %
HCT: 34.7 % — ABNORMAL LOW (ref 36.0–46.0)
Hemoglobin: 10.9 g/dL — ABNORMAL LOW (ref 12.0–15.0)
Immature Granulocytes: 0 %
Lymphocytes Relative: 30 %
Lymphs Abs: 1.4 K/uL (ref 0.7–4.0)
MCH: 27 pg (ref 26.0–34.0)
MCHC: 31.4 g/dL (ref 30.0–36.0)
MCV: 85.9 fL (ref 80.0–100.0)
Monocytes Absolute: 0.3 K/uL (ref 0.1–1.0)
Monocytes Relative: 6 %
Neutro Abs: 2.8 K/uL (ref 1.7–7.7)
Neutrophils Relative %: 60 %
Platelets: 200 K/uL (ref 150–400)
RBC: 4.04 MIL/uL (ref 3.87–5.11)
RDW: 15.4 % (ref 11.5–15.5)
WBC: 4.7 K/uL (ref 4.0–10.5)
nRBC: 0 % (ref 0.0–0.2)

## 2023-12-31 LAB — MAGNESIUM: Magnesium: 2.3 mg/dL (ref 1.7–2.4)

## 2023-12-31 LAB — RAPID HIV SCREEN (HIV 1/2 AB+AG)
HIV 1/2 Antibodies: NONREACTIVE
HIV-1 P24 Antigen - HIV24: NONREACTIVE

## 2023-12-31 LAB — HEPATITIS C ANTIBODY: HCV Ab: NONREACTIVE

## 2023-12-31 LAB — HCG, QUANTITATIVE, PREGNANCY: hCG, Beta Chain, Quant, S: <1 m[IU]/mL (ref <5)

## 2023-12-31 MED ORDER — CEFTRIAXONE SODIUM 1 G IJ SOLR: 500.0000 mg | Freq: Once | INTRAMUSCULAR | Status: DC

## 2023-12-31 MED ORDER — ONDANSETRON 4 MG PO TBDP
4.0000 mg | ORAL_TABLET | Freq: Once | ORAL | Status: AC
  Administered 2023-12-31: 4 mg via ORAL
  Filled 2023-12-31: qty 1

## 2023-12-31 MED ORDER — LIDOCAINE HCL (PF) 1 % IJ SOLN: 1.0000 mL | Freq: Once | INTRAMUSCULAR | Status: DC

## 2023-12-31 MED ORDER — AZITHROMYCIN 500 MG PO TABS
1000.0000 mg | ORAL_TABLET | Freq: Once | ORAL | Status: AC
  Administered 2023-12-31: 1000 mg via ORAL
  Filled 2023-12-31: qty 2

## 2023-12-31 MED ORDER — SODIUM CHLORIDE 0.9 % IV SOLN
1.0000 g | Freq: Once | INTRAVENOUS | Status: AC
  Administered 2023-12-31: 1 g via INTRAVENOUS
  Filled 2023-12-31: qty 10

## 2023-12-31 MED ORDER — LEVETIRACETAM 100 MG/ML PO SOLN: 500.0000 mg | Freq: Two times a day (BID) | ORAL | 2 refills | Status: DC

## 2023-12-31 MED ORDER — METRONIDAZOLE 500 MG PO TABS
2000.0000 mg | ORAL_TABLET | Freq: Once | ORAL | Status: AC
  Administered 2023-12-31: 2000 mg via ORAL
  Filled 2023-12-31: qty 4

## 2023-12-31 MED ORDER — BICTEGRAVIR-EMTRICITAB-TENOFOV 50-200-25 MG PO TABS
1.0000 | ORAL_TABLET | Freq: Every day | ORAL | 0 refills | Status: DC
  Filled 2023-12-31: qty 30, 30d supply, fill #0

## 2023-12-31 MED ORDER — BICTEGRAVIR-EMTRICITAB-TENOFOV 50-200-25 MG PO PREPACK
1.0000 | ORAL_TABLET | Freq: Every day | ORAL | Status: DC
  Administered 2023-12-31: 1 via ORAL
  Filled 2023-12-31: qty 1

## 2023-12-31 MED ORDER — BICTEGRAVIR-EMTRICITAB-TENOFOV 50-200-25 MG PO TABS
1.0000 | ORAL_TABLET | Freq: Every day | ORAL | 0 refills | Status: AC
  Filled 2023-12-31: qty 30, 30d supply, fill #0

## 2023-12-31 MED ORDER — LEVETIRACETAM 100 MG/ML PO SOLN
500.0000 mg | Freq: Two times a day (BID) | ORAL | 1 refills | Status: AC
  Filled 2023-12-31: qty 180, 18d supply, fill #0

## 2023-12-31 NOTE — Discharge Instructions (Addendum)
 Continue to take your Keppra  as prescribed.  Follow-up with Dr. Lane.  Thank you for choosing us  for your health care today!  Please see your primary doctor this week for a follow up appointment.   If you have any new, worsening, or unexpected symptoms call your doctor right away or come back to the emergency department for reevaluation.  It was my pleasure to care for you today.   Ginnie EDISON Cyrena, MD
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
## 2023-12-31 NOTE — ED Triage Notes (Signed)
 Alert/oriented x4. No postictal symptoms appreciated.

## 2023-12-31 NOTE — ED Provider Notes (Signed)
-----------------------------------------   3:14 PM on 12/31/2023 -----------------------------------------  Blood pressure 126/86, pulse 81, temperature 98.5 F (36.9 C), temperature source Oral, resp. rate 12, height 5' 6 (1.676 m), weight 50.8 kg, SpO2 100%, not currently breastfeeding.  Assuming care from Dr. Waymond.  In short, Maureen George is a 28 y.o. female with a chief complaint of Seizures .  Refer to the original H&P for additional details.  The current plan of care is to follow-up SANE recs and TOC recs.  ----------------------------------------- 5:57 PM on 12/31/2023 ----------------------------------------- Patient evaluated by SANE and received prophylactic medications for HIV and other sexually transmitted infections.  Rapid HIV testing is negative and CMP without significant electrolyte abnormality or AKI.  Patient cleared by SANE for discharge and representative from [facility] is here in the ED to take patient to a safe location.  She was provided with additional resources, counseled to return to the ED for new or worsening symptoms.  Patient agrees with plan.       Willo Dunnings, MD 12/31/23 1758

## 2023-12-31 NOTE — ED Notes (Signed)
 [facility] at bedside with patient.
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
## 2023-12-31 NOTE — Discharge Instructions (Addendum)
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 blistering, peeling, or loosening of the skin, including inside the mouth Seizures Severe diarrhea, fever Sudden eye pain or change in vision such as blurry vision, seeing halos around lights, vision loss Unusual vaginal discharge, itching, or odor Side effects that usually do not require medical attention (report these to your care team if they continue or are bothersome): Diarrhea Metallic taste in mouth Nausea Stomach pain  This list may not describe all possible side effects. Call your doctor for medical advice about side effects. You may report side effects to  FDA at 1-800-FDA-1088.  Where should I keep my medication? Keep out of the reach of children and pets. Store between 15 and 25 degrees C (59 and 77 degrees F). Protect from light. Get rid of any unused medication after the expiration date. To get rid of medications that are no longer needed or have expired: Take the medication to a medication take-back program. Check with  your pharmacy or law enforcement to find a location. If you cannot return the medication, check the label or package insert to see if the medication should be thrown out in the garbage or flushed down the toilet. If you are not sure, ask your care team. If it is safe to put it in the trash, take the medication out of the container. Mix the medication with cat litter, dirt, coffee grounds, or other unwanted substance. Seal the mixture in a bag or container. Put it in the trash.  NOTE: This sheet is a summary. It may not cover all possible information. If you have questions about this medicine, talk to your doctor, pharmacist, or health care provider.  2024 Elsevier/Gold Standard (2022-03-14 00:00:00) Ceftriaxone Injection  What is this medication? CEFTRIAXONE (sef try AX one) treats infections caused by bacteria. It belongs to a group of medications called cephalosporin antibiotics. It will not treat colds, the flu, or infections caused by viruses. This medicine may be used for other purposes; ask your health care provider or pharmacist if you have questions. COMMON BRAND NAME(S): Ceftri-IM, Ceftrisol Plus, Rocephin  What should I tell my care team before I take this medication? They need to know if you have any of these conditions: Bleeding disorder High bilirubin level in newborn patients Kidney disease Liver disease Poor nutrition An unusual or allergic reaction to ceftriaxone, other penicillin  or cephalosporin antibiotics, other medications, foods, dyes, or preservatives Pregnant or trying to get pregnant Breast-feeding  How should I use this medication? This medication is injected into a vein or a muscle. It is usually given by your care team in a hospital or clinic setting. It may also be given at home. If you get this medication at home, you will be taught how to prepare and give it. Use exactly as directed. Take it as directed on the prescription label at the same time every day.  Keep taking it even if you think you are better. It is important that you put your used needles and syringes in a special sharps container. Do not put them in a trash can. If you do not have a sharps container, call your pharmacist or care team to get one. Talk to your care team about the use of this medication in children. While it may be prescribed for children as young as newborns for selected conditions, precautions do apply. Overdosage: If you think you have taken too much of this medicine contact a poison control center or emergency room at once. NOTE: This medicine is only for you. Do not share this medicine with others.  What if I miss a dose? If you get this medication at the hospital or clinic: It is important not to miss your dose. Call your care team if you are unable to keep an appointment. If you give yourself this medication at home: If you miss a dose, take it as soon as you can. Then continue your normal schedule. If it is almost time for your next dose, take only that dose. Do not take double or extra doses. Call your care team with  questions. What may interact with this medication? Estrogen or progestin hormones Intravenous calcium  This list may not describe all possible interactions. Give your health care provider a list of all the medicines, herbs, non-prescription drugs, or dietary supplements you use. Also tell them if you smoke, drink alcohol, or use illegal drugs. Some items may interact with your medicine.  What should I watch for while using this medication? Tell your care team if your symptoms do not start to get better or if they get worse. Do not treat diarrhea with over the counter products. Contact your care team if you have diarrhea that lasts more than 2 days or if it is severe and watery. If you have diabetes, you may get a false-positive result for sugar in your urine. Check with your care team. If you are being treated for a sexually transmitted infection (STI),  avoid sexual contact until you have finished your treatment. Your partner may also need treatment.  What side effects may I notice from receiving this medication? Side effects that you should report to your care team as soon as possible: Allergic reactions--skin rash, itching, hives, swelling of the face, lips, tongue, or throat Hemolytic anemia--unusual weakness or fatigue, dizziness, headache, trouble breathing, dark urine, yellowing skin or eyes Severe diarrhea, fever Unusual vaginal discharge, itching, or odor Side effects that usually do not require medical attention (report to your care team if they continue or are bothersome): Diarrhea Headache Nausea Pain, redness, or irritation at injection site  This list may not describe all possible side effects. Call your doctor for medical advice about side effects. You may report side effects to FDA at 1-800-FDA-1088.  Where should I keep my medication? Keep out of the reach of children and pets. You will be instructed on how to store this medication. Get rid of any unused medication after the expiration date. To get rid of medications that are no longer needed or have expired: Take the medication to a medication take-back program. Check with your pharmacy or law enforcement to find a location. If you cannot return the medication, ask your pharmacist or care team how to get rid of this medication safely.  NOTE: This sheet is a summary. It may not cover all possible information. If you have questions about this medicine, talk to your doctor, pharmacist, or health care provider.  2024 Elsevier/Gold Standard (2021-05-02 00:00:00) Soft Tissue Injury of the Neck (Strangulation)  A soft tissue injury of the neck is serious and needs medical care right away.  Some injuries do not break the skin (blunt injury).  Some injuries do break the skin (penetrating injury) and create an open wound.  You may feel fine at first, but the puffiness (swelling)  in your throat can slowly make it harder to breathe.  This could cause serious or life-threatening injury.  There could be damage to major blood vessels and nerves in the neck.    Be sure to tell your health care provider how the injury occurred and if someone else caused the injury.  Also, tell the provider if any object or hands were used to cause the injury (such as rope, clothesline, telephone cord, etc).    Home Care Get help right away if: Your voice gets weaker or hoarse Your puffiness or bruising does not get better You have new puffiness or bruising in the face or neck Your pain gets worse  You have trouble swallowing You cough up blood You have trouble breathing You start to drool  You start throwing up (vomiting) You have a fever of 102 degrees  Neck Contusion A neck contusion is a deep bruise in the neck. It is caused by a direct force (blunt trauma) to the neck. Although neck contusions can be mild, this type of neck injury can also be quite dangerous because it could affect the important structures in your neck, including: Neck muscles. Large blood vessels (carotid arteries and jugular veins). The bones of the the cervical spine (vertebrate) and the spinal cord nerves. The airway. This includes the voice box (larynx) and the windpipe (trachea). The tube that lets you swallow (esophagus). A neck contusion can cause swelling and bleeding in your neck that can press on your throat and larynx. This can narrow your airway and cause difficulty with breathing (respiratory distress). Contusions may also be associated with other injuries, such as broken bones (fractures) and cuts (lacerations) to the skin and deeper neck structures. What are the causes? This condition may be caused by: Motor vehicle accidents that cause: Blunt trauma to the neck. Extreme and sudden twisting motion of the head (whiplash). Injuries from the seatbelt across the neck. Sports injuries, such as blows  from football, martial arts, wrestling, and hockey. Bicycle injuries. Assault injuries, including choking (strangulation). What are the signs or symptoms? Symptoms of this condition include: Pain. Swelling and discoloration. Bruising or stiffness. Blood accumulation under the skin (hematoma). Other symptoms depend on what structures are affected. A contusion that affects a carotid artery may cause an expanding lump in the neck, as well as: Dizziness. Decreasing consciousness. Weakness on the side of the body that is opposite from the contusion, due to decreased blood flow to the brain. A contusion that affects the airway may cause: Difficulty with breathing. Noisy breathing. A hoarse or weak voice. Coughing up blood. A contusion that affects the esophagus may cause: Difficulty with swallowing. Spitting up blood. How is this diagnosed? This condition is diagnosed based on: A physical exam. Your symptoms. Your history of blunt trauma. You may also have tests to help rule out a more serious injury. Tests may include: X-ray. CT scan. MRI. Angiography. Certain areas of the neck contain more important structures and may require more evaluation than others. Sometimes, more testing may may be needed to check for injuries. this may include a test that allows a health care provider to view the airway from inside (video laryngoscopy). How is this treated? In most cases, an uncomplicated neck contusion can be treated with home care. This includes rest, ice, and over-the-counter pain medicine, such as ibuprofen . Other treatment depends on possible complications that you may have. Respiratory distress is the most dangerous complication of neck contusion. This is a medical emergency that requires immediate treatment. Treatment may include having: A breathing tube inserted into your larynx (endotracheal intubation). An emergency procedure to create a hole in your larynx or trachea for a  breathing tube (tracheotomy or cricothyrotomy). Surgery to repair any damage to the esophagus or the large blood vessels in the neck. Drainage of a hematoma in your neck. A brace may be used (cervical collar) if you have injured your spine. This will keep your neck from moving and prevent any further injury to your spine. Follow these instructions at home: Managing pain, stiffness, and swelling  If directed, put ice on the injured area: Put ice in a plastic bag. Place a towel between your skin and the bag. Leave the ice on for 20 minutes, 2-3 times a day. Remove the  ice if your skin turns bright red. This is very important. If you cannot feel pain, heat, or cold, you have a greater risk of damage to the area. Raise (elevate) the injured area above the level of your heart while you are sitting or lying down. General instructions  Take over-the-counter and prescription medicines only as told by your health care provider. Rest at told by your doctor. Keep your head and neck elevated above the level of your heart while you sleep. If you were given a cervical collar, wear it as told by your health care provider. Do not continue to wear the collar for longer than recommended by your health care provider. Follow instructions from your health care provider about what you can or cannot eat. Often, only fluids and soft foods are recommended until you heal. Keep all follow-up visits. This is important. Contact a health care provider if: Your pain does not get better in 2-3 days. You develop increasing pain or difficulty with swallowing. You develop a fever. Get help right away if: You suddenly have difficulty breathing. Your swelling gets worse. You have noisy breathing. You cough up blood. You cannot swallow. You vomit. You are dizzy or you faint. You develop a drooping face, sudden weakness on one side of your body, difficulty speaking, or difficulty understanding speech. These symptoms may  represent a serious problem that is an emergency. Do not wait to see if the symptoms will go away. Get medical help right away. Call your local emergency services (911 in the [place] Do not drive yourself to the hospital. Summary A neck contusion is a deep bruise in the neck. It is caused by a direct force (blunt trauma) to the neck. This type of neck injury is dangerous because it could affect the important structures in your neck. These include blood vessels, airway structures, bones and spinal cord nerves. Take over-the-counter and prescription medicines only as told by your health care provider. Keep your head and neck at least partially raised (elevated) above the level of your heart. Do this even when you sleep. Get help right away if you have difficulty breathing, cough up blood, cannot swallow, or have other new or worsening symptoms. This information is not intended to replace advice given to you by your health care provider. Make sure you discuss any questions you have with your health care provider. Document Revised: 03/29/2020 Document Reviewed: 03/29/2020 Elsevier Patient Education  2022 Elsevier Inc.ted from [place] Patient Information  [place] Crime Victim's Compensation:  The state advocates (contact information on flyer) or local advocates from a [employer] may be able to assist with completing the application; in order to be considered for assistance; the crime must be reported to law enforcement within 72 hours unless there is good cause for delay; you must fully cooperate with law enforcement and prosecution regarding the case; the crime must have occurred in  or in a state that does not offer crime victim compensation. [employer]

## 2023-12-31 NOTE — ED Notes (Signed)
 No seizure activity noted. Presents alert, speech clear/fluent. Was ambulatory for EMS on scene PTA.
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
## 2024-01-01 ENCOUNTER — Other Ambulatory Visit (HOSPITAL_COMMUNITY): Payer: Self-pay

## 2024-01-01 ENCOUNTER — Other Ambulatory Visit: Payer: Self-pay

## 2024-01-01 LAB — LEVETIRACETAM LEVEL: Levetiracetam Lvl: 12.5 ug/mL (ref 10.0–40.0)

## 2024-01-01 LAB — RPR: RPR Ser Ql: NONREACTIVE
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
## 2024-02-08 ENCOUNTER — Encounter: Payer: Self-pay | Admitting: Obstetrics & Gynecology

## 2024-02-11 ENCOUNTER — Other Ambulatory Visit: Payer: Self-pay | Admitting: Obstetrics & Gynecology

## 2024-02-11 DIAGNOSIS — J45909 Unspecified asthma, uncomplicated: Secondary | ICD-10-CM

## 2024-02-11 MED ORDER — LEVETIRACETAM 100 MG/ML PO SOLN: 500.0000 mg | Freq: Two times a day (BID) | ORAL | 2 refills | Status: AC

## 2024-02-11 MED ORDER — ALBUTEROL SULFATE HFA 108 (90 BASE) MCG/ACT IN AERS: 2.0000 | INHALATION_SPRAY | Freq: Four times a day (QID) | RESPIRATORY_TRACT | 0 refills | Status: AC | PRN

## 2024-02-11 NOTE — Progress Notes (Signed)
 Reorder medication due to change in pharmacy  Aero Drummonds, DO Attending Obstetrician & Gynecologist, [facility] for [employer], [facility]

## 2024-04-10 ENCOUNTER — Telehealth: Payer: Self-pay | Admitting: Neurology

## 2024-04-10 ENCOUNTER — Encounter: Payer: Self-pay | Admitting: Neurology

## 2024-04-10 ENCOUNTER — Ambulatory Visit: Payer: MEDICAID | Admitting: Neurology

## 2024-04-10 NOTE — Telephone Encounter (Signed)
 Please check with Angie. Ok to dismiss.

## 2024-04-10 NOTE — Telephone Encounter (Signed)
 Pt had her third no show 04/10/2024.
